# Patient Record
Sex: Female | Born: 1990 | Race: Black or African American | Hispanic: No | Marital: Single | Smoking: Former smoker
Health system: Southern US, Community
[De-identification: ages and names within clinical notes are randomized; demographics above are authoritative.]

## PROBLEM LIST (undated history)

## (undated) ENCOUNTER — Emergency Department: Admission: EM | Payer: Self-pay | Source: Home / Self Care

## (undated) ENCOUNTER — Inpatient Hospital Stay (HOSPITAL_COMMUNITY): Payer: Self-pay

## (undated) DIAGNOSIS — R87629 Unspecified abnormal cytological findings in specimens from vagina: Secondary | ICD-10-CM

## (undated) HISTORY — PX: NO PAST SURGERIES: SHX2092

---

## 2012-08-05 ENCOUNTER — Encounter (HOSPITAL_COMMUNITY): Payer: Self-pay | Admitting: Nurse Practitioner

## 2012-08-05 ENCOUNTER — Emergency Department (HOSPITAL_COMMUNITY)
Admission: EM | Admit: 2012-08-05 | Discharge: 2012-08-05 | Disposition: A | Discharge status: Home / Self Care | Payer: BC Managed Care – PPO | Attending: Emergency Medicine | Admitting: Emergency Medicine

## 2012-08-05 DIAGNOSIS — R300 Dysuria: Secondary | ICD-10-CM | POA: Insufficient documentation

## 2012-08-05 DIAGNOSIS — Z3202 Encounter for pregnancy test, result negative: Secondary | ICD-10-CM | POA: Insufficient documentation

## 2012-08-05 DIAGNOSIS — N730 Acute parametritis and pelvic cellulitis: Secondary | ICD-10-CM

## 2012-08-05 DIAGNOSIS — N898 Other specified noninflammatory disorders of vagina: Secondary | ICD-10-CM | POA: Insufficient documentation

## 2012-08-05 DIAGNOSIS — Z8619 Personal history of other infectious and parasitic diseases: Secondary | ICD-10-CM | POA: Insufficient documentation

## 2012-08-05 DIAGNOSIS — R350 Frequency of micturition: Secondary | ICD-10-CM | POA: Insufficient documentation

## 2012-08-05 DIAGNOSIS — F17200 Nicotine dependence, unspecified, uncomplicated: Secondary | ICD-10-CM | POA: Insufficient documentation

## 2012-08-05 LAB — POCT PREGNANCY, URINE: Preg Test, Ur: NEGATIVE

## 2012-08-05 LAB — URINALYSIS, ROUTINE W REFLEX MICROSCOPIC
Glucose, UA: NEGATIVE mg/dL
Protein, ur: NEGATIVE mg/dL
Specific Gravity, Urine: 1.018 (ref 1.005–1.030)
Urobilinogen, UA: 1 mg/dL (ref 0.0–1.0)

## 2012-08-05 LAB — CBC WITH DIFFERENTIAL/PLATELET
Basophils Absolute: 0 10*3/uL (ref 0.0–0.1)
HCT: 37.7 % (ref 36.0–46.0)
Lymphocytes Relative: 33 % (ref 12–46)
Neutro Abs: 6.1 10*3/uL (ref 1.7–7.7)
Platelets: 266 10*3/uL (ref 150–400)
RDW: 13.3 % (ref 11.5–15.5)
WBC: 10 10*3/uL (ref 4.0–10.5)

## 2012-08-05 LAB — COMPREHENSIVE METABOLIC PANEL
ALT: 9 U/L (ref 0–35)
AST: 12 U/L (ref 0–37)
Alkaline Phosphatase: 53 U/L (ref 39–117)
CO2: 25 mEq/L (ref 19–32)
Chloride: 106 mEq/L (ref 96–112)
GFR calc non Af Amer: >90 mL/min (ref >90)
Sodium: 141 mEq/L (ref 135–145)
Total Bilirubin: 0.2 mg/dL — ABNORMAL LOW (ref 0.3–1.2)

## 2012-08-05 LAB — URINE MICROSCOPIC-ADD ON

## 2012-08-05 LAB — WET PREP, GENITAL: Yeast Wet Prep HPF POC: NONE SEEN

## 2012-08-05 MED ORDER — CEFTRIAXONE SODIUM 250 MG IJ SOLR
250.0000 mg | Freq: Once | INTRAMUSCULAR | Status: AC
  Administered 2012-08-05: 250 mg via INTRAMUSCULAR
  Filled 2012-08-05: qty 250

## 2012-08-05 MED ORDER — OXYCODONE-ACETAMINOPHEN 5-325 MG PO TABS
2.0000 | ORAL_TABLET | Freq: Once | ORAL | Status: AC
  Administered 2012-08-05: 2 via ORAL
  Filled 2012-08-05: qty 2

## 2012-08-05 MED ORDER — DOXYCYCLINE HYCLATE 100 MG PO CAPS: 100.0000 mg | ORAL_CAPSULE | Freq: Two times a day (BID) | ORAL | Status: AC

## 2012-08-05 MED ORDER — ACETAMINOPHEN-CODEINE #3 300-30 MG PO TABS: 1.0000 | ORAL_TABLET | ORAL | Status: DC | PRN

## 2012-08-05 NOTE — ED Notes (Signed)
Pt discharged to home with family. NAD.  

## 2012-08-05 NOTE — ED Provider Notes (Signed)
History    CSN: [id number] Arrival date & time 08/05/12  1340  First MD Initiated Contact with Patient 08/05/12 1417     Chief Complaint  Patient presents with  . Abdominal Pain   HPI  Pt is a 22 yo AA female with pmh of trichomonas infection who presents with lower abdominal pain that began three days ago. Pt describes intermittent 8/10 crampy lower abdominal pain with radiation to groin that began three days ago and seems to be getting worse, with no relief with tylenol. Pt reports she has never had these symptoms before. She also has urinary frequency and thick white discharge for three days. No urgency, burning, dysuria, or hematuria. No fevers, chills, nausea, vomiting, or change in bowel movement.   LMP was June 24th. Pt is sexually active with 2 partners, one female and one female. She is not on birth control but uses protection. Menarche was at age 55 and periods are normal and regular with normal blood loss. She had trichomonas infection at age 62 that was treated. She has been tested for HIV and waiting for results. Had miscarriage when she was 22 years old.      History reviewed. No pertinent past medical history. History reviewed. No pertinent past surgical history. History reviewed. No pertinent family history. History  Substance Use Topics  . Smoking status: Current Every Day Smoker -- 0.01 packs/day  . Smokeless tobacco: Not on file  . Alcohol Use: No   OB History   Grav Para Term Preterm Abortions TAB SAB Ect Mult Living                 Review of Systems  Constitutional: Positive for appetite change. Negative for fever, chills, fatigue and unexpected weight change.  Respiratory: Negative for shortness of breath.   Cardiovascular: Negative for chest pain and leg swelling.  Gastrointestinal: Positive for abdominal pain. Negative for nausea, vomiting, diarrhea, constipation and blood in stool.  Genitourinary: Positive for dysuria, frequency and vaginal discharge.  Negative for urgency, flank pain, vaginal bleeding and difficulty urinating.  Musculoskeletal: Negative for myalgias.  Neurological: Negative for dizziness and weakness.    Allergies  Review of patient's allergies indicates no known allergies.  Home Medications  No current outpatient prescriptions on file. BP 104/57  Pulse 74  Temp(Src) 98.7 F (37.1 C) (Oral)  Resp 16  Ht 5\' 5"  (1.651 m)  Wt 165 lb (74.844 kg)  BMI 27.46 kg/m2  SpO2 100% Physical Exam  Constitutional: She is oriented to person, place, and time. She appears well-developed and well-nourished. No distress.  HENT:  Head: Normocephalic and atraumatic.  Eyes: EOM are normal.  Neck: Normal range of motion. Neck supple.  Cardiovascular: Normal rate and regular rhythm.   Pulmonary/Chest: Effort normal and breath sounds normal. No respiratory distress.  Abdominal: Soft. Bowel sounds are normal. She exhibits no distension. There is tenderness (tenderness to palpation of lower abominal region and groin L>R). There is no rebound and no guarding.  Genitourinary: Vagina normal.  Pelvic exam - cervical motion/adnexal tenderness, normal appearing cervical os with surrounding white discharge, no punctuate hemorrhages,  fishy odor, or  lesions/ulcerations on vagina  Musculoskeletal: Normal range of motion. She exhibits no edema.  Neurological: She is alert and oriented to person, place, and time.  Skin: Skin is warm and dry. She is not diaphoretic.  Psychiatric: She has a normal mood and affect.    ED Course  Procedures (including critical care time) Labs Reviewed  WET  PREP, GENITAL - Abnormal; Notable for the following:    Trich, Wet Prep MODERATE (*)    Clue Cells Wet Prep HPF POC FEW (*)    WBC, Wet Prep HPF POC MODERATE (*)    All other components within normal limits  URINALYSIS, ROUTINE W REFLEX MICROSCOPIC - Abnormal; Notable for the following:    APPearance CLOUDY (*)    Nitrite POSITIVE (*)    Leukocytes, UA  TRACE (*)    All other components within normal limits  COMPREHENSIVE METABOLIC PANEL - Abnormal; Notable for the following:    Total Bilirubin 0.2 (*)    All other components within normal limits  URINE MICROSCOPIC-ADD ON - Abnormal; Notable for the following:    Bacteria, UA FEW (*)    All other components within normal limits  GC/CHLAMYDIA PROBE AMP  URINE CULTURE  CBC WITH DIFFERENTIAL  POCT PREGNANCY, URINE   No results found. 1. PID (acute pelvic inflammatory disease)     MDM  Assessment: 22 yo AA female with pmh of trichomonas infection who presents with lower abdominal pain and urinary frequency that began three days ago.     Plan:  Abdominal Pain - UTI vs Cervicitis vs PID  -Obtain urine pregnancy test ---> negative - Perform pelvic exam- bimanual and obtain cervical specimen --> cervical motion tenderness and white mucous cervical discharge on exam -Obtain Gonorrhea/chlymadia  DNA assay ---> awaiting results  -Obtain trichomonas wet mount prep -Obtain UA and culture ---> awaiting results -Obtain CBC w/ diff ---> WNL -Obtain CMP --> WNL -Administer percocet 5-325mg  for pain   Disposition:  Home ---> Pt not pregnant, is afebrile,  with no leukocytosis and lower abdominal/pelvic pain with cervical motion/adnexal tenderness and cervical discharge on exam-> suspect PID. Awaiting  STD testing,  given ceftriaxone 250 mg IM in ED for suspected gonorrhea cervicitis and prescribed doxycylcine x 14 days for chylamydia cervicitis coinfection. Also awaiting UA and wet mount results.     Discharge:  -To take Doxycycline x 14 days  -Acetaminophen-codeine 300-30mg  Q4 hrs pain -[facility] or sooner   Update: After viewing results of UA and wet mount, called patient to tell her to  [facility] since will need treatment for trichomonas/bacterial vaginosis and UTI infection.       Otis Brace, MD 08/05/12 2004  Otis Brace, MD 08/05/12 2105

## 2012-08-05 NOTE — Discharge Instructions (Signed)
Pelvic Inflammatory Disease °Pelvic inflammatory disease (PID) refers to an infection in some or all of the female organs. The infection can be in the uterus, ovaries, fallopian tubes, or the surrounding tissues in the pelvis. PID can cause abdominal or pelvic pain that comes on suddenly (acute pelvic pain). PID is a serious infection because it can lead to lasting (chronic) pelvic pain or the inability to have children (infertile).  °CAUSES  °The infection is often caused by the normal bacteria found in the vaginal tissues. PID may also be caused by an infection that is spread during sexual contact. PID can also occur following:  °· The birth of a baby.   °· A miscarriage.   °· An abortion.   °· Major pelvic surgery.   °· The use of an intrauterine device (IUD).   °· A sexual assault.   °RISK FACTORS °Certain factors can put a person at higher risk for PID, such as: °· Being younger than 25 years. °· Being sexually active at a young age. °· Using nonbarrier contraception. °· Having multiple sexual partners. °· Having sex with someone who has symptoms of a genital infection. °· Using oral contraception. °Other times, certain behaviors can increase the possibility of getting PID, such as: °· Having sex during your period. °· Using a vaginal douche. °· Having an intrauterine device (IUD) in place. °SYMPTOMS  °· Abdominal or pelvic pain.   °· Fever.   °· Chills.   °· Abnormal vaginal discharge. °· Abnormal uterine bleeding.   °· Unusual pain shortly after finishing your period. °DIAGNOSIS  °Your caregiver will choose some of the following methods to make a diagnosis, such as:  °· Performing a physical exam and history. A pelvic exam typically reveals a very tender uterus and surrounding pelvis.   °· Ordering laboratory tests including a pregnancy test, blood tests, and urine test.  °· Ordering cultures of the vagina and cervix to check for a sexually transmitted infection (STI). °· Performing an ultrasound.    °· Performing a laparoscopic procedure to look inside the pelvis.   °TREATMENT  °· Antibiotic medicines may be prescribed and taken by mouth.   °· Sexual partners may be treated when the infection is caused by a sexually transmitted disease (STD).   °· Hospitalization may be needed to give antibiotics intravenously. °· Surgery may be needed, but this is rare. °It may take weeks until you are completely well. If you are diagnosed with PID, you should also be checked for human immunodeficiency virus (HIV).   °HOME CARE INSTRUCTIONS  °· If given, take your antibiotics as directed. Finish the medicine even if you start to feel better.   °· Only take over-the-counter or prescription medicines for pain, discomfort, or fever as directed by your caregiver.   °· Do not have sexual intercourse until treatment is completed or as directed by your caregiver. If PID is confirmed, your recent sexual partner(s) will need treatment.   °· Keep your follow-up appointments. °SEEK MEDICAL CARE IF:  °· You have increased or abnormal vaginal discharge.   °· You need prescription medicine for your pain.   °· You vomit.   °· You cannot take your medicines.   °· Your partner has an STD.   °SEEK IMMEDIATE MEDICAL CARE IF:  °· You have a fever.   °· You have increased abdominal or pelvic pain.   °· You have chills.   °· You have pain when you urinate.   °· You are not better after 72 hours following treatment.   °MAKE SURE YOU:  °· Understand these instructions. °· Will watch your condition. °· Will get help right away if you are not doing well or get worse. °  pelvic pain.    You have chills.    You have pain when you urinate.    You are not better after 72 hours following treatment.   MAKE SURE YOU:    Understand these instructions.   Will watch your condition.   Will get help right away if you are not doing well or get worse.  Document Released: 01/10/2005 Document Revised: 10/05/2011 Document Reviewed: 01/06/2011  [employer] Patient Information 2014 [employer].

## 2012-08-05 NOTE — ED Notes (Signed)
C/o intermittent abd and pelvic pain x 3 days, describes as "sharp cramps." Denies any bowel/bladder changes. Reports thick vaginal discharge also

## 2012-08-06 LAB — URINE CULTURE: Colony Count: NO GROWTH

## 2012-08-07 NOTE — ED Provider Notes (Signed)
I performed a history and physical examination of  Katherine Spears and discussed her management with Dr. Johna Roles. I agree with the history, physical, assessment, and plan of care, with the following exceptions: None I was present for the following procedures: None  Time Spent in Critical Care of the patient: None  Time spent in discussions with the patient and family: 5 minutes  Katherine Spears   Derwood Kaplan, MD 08/07/12 0300

## 2012-08-08 LAB — GC/CHLAMYDIA PROBE AMP
CT Probe RNA: NEGATIVE
GC Probe RNA: NEGATIVE

## 2013-09-12 ENCOUNTER — Encounter (HOSPITAL_COMMUNITY): Payer: Self-pay | Admitting: Emergency Medicine

## 2013-09-12 ENCOUNTER — Emergency Department (HOSPITAL_COMMUNITY)
Admission: EM | Admit: 2013-09-12 | Discharge: 2013-09-12 | Disposition: A | Discharge status: Home / Self Care | Payer: Medicaid Other | Attending: Emergency Medicine | Admitting: Emergency Medicine

## 2013-09-12 ENCOUNTER — Emergency Department (HOSPITAL_COMMUNITY): Payer: Medicaid Other

## 2013-09-12 DIAGNOSIS — R109 Unspecified abdominal pain: Secondary | ICD-10-CM | POA: Diagnosis not present

## 2013-09-12 DIAGNOSIS — O9989 Other specified diseases and conditions complicating pregnancy, childbirth and the puerperium: Secondary | ICD-10-CM | POA: Diagnosis present

## 2013-09-12 DIAGNOSIS — R5383 Other fatigue: Secondary | ICD-10-CM

## 2013-09-12 DIAGNOSIS — R42 Dizziness and giddiness: Secondary | ICD-10-CM | POA: Diagnosis not present

## 2013-09-12 DIAGNOSIS — R110 Nausea: Secondary | ICD-10-CM

## 2013-09-12 DIAGNOSIS — Z79899 Other long term (current) drug therapy: Secondary | ICD-10-CM | POA: Insufficient documentation

## 2013-09-12 DIAGNOSIS — N898 Other specified noninflammatory disorders of vagina: Secondary | ICD-10-CM | POA: Insufficient documentation

## 2013-09-12 DIAGNOSIS — R5381 Other malaise: Secondary | ICD-10-CM | POA: Insufficient documentation

## 2013-09-12 DIAGNOSIS — O210 Mild hyperemesis gravidarum: Secondary | ICD-10-CM | POA: Insufficient documentation

## 2013-09-12 DIAGNOSIS — O99330 Smoking (tobacco) complicating pregnancy, unspecified trimester: Secondary | ICD-10-CM | POA: Insufficient documentation

## 2013-09-12 DIAGNOSIS — Z3490 Encounter for supervision of normal pregnancy, unspecified, unspecified trimester: Secondary | ICD-10-CM

## 2013-09-12 DIAGNOSIS — R531 Weakness: Secondary | ICD-10-CM

## 2013-09-12 LAB — CBC
HCT: 37.5 % (ref 36.0–46.0)
Hemoglobin: 12.4 g/dL (ref 12.0–15.0)
MCH: 28.3 pg (ref 26.0–34.0)
MCHC: 33.1 g/dL (ref 30.0–36.0)
MCV: 85.6 fL (ref 78.0–100.0)
Platelets: 255 10*3/uL (ref 150–400)
RBC: 4.38 MIL/uL (ref 3.87–5.11)
RDW: 12.9 % (ref 11.5–15.5)
WBC: 11.7 10*3/uL — ABNORMAL HIGH (ref 4.0–10.5)

## 2013-09-12 LAB — URINALYSIS, ROUTINE W REFLEX MICROSCOPIC
Bilirubin Urine: NEGATIVE
Glucose, UA: NEGATIVE mg/dL
Hgb urine dipstick: NEGATIVE
Ketones, ur: 15 mg/dL — AB
Leukocytes, UA: NEGATIVE
Nitrite: NEGATIVE
Protein, ur: NEGATIVE mg/dL
Specific Gravity, Urine: 1.028 (ref 1.005–1.030)
Urobilinogen, UA: 1 mg/dL (ref 0.0–1.0)
pH: 7 (ref 5.0–8.0)

## 2013-09-12 LAB — BASIC METABOLIC PANEL
Anion gap: 18 — ABNORMAL HIGH (ref 5–15)
BUN: 7 mg/dL (ref 6–23)
CHLORIDE: 103 meq/L (ref 96–112)
CO2: 19 mEq/L (ref 19–32)
CREATININE: 0.59 mg/dL (ref 0.50–1.10)
Calcium: 9.6 mg/dL (ref 8.4–10.5)
GFR calc Af Amer: >90 mL/min (ref >90)
GFR calc non Af Amer: >90 mL/min (ref >90)
GLUCOSE: 61 mg/dL — AB (ref 70–99)
POTASSIUM: 4 meq/L (ref 3.7–5.3)
Sodium: 140 mEq/L (ref 137–147)

## 2013-09-12 LAB — ABO/RH: ABO/RH(D): O POS

## 2013-09-12 LAB — HCG, QUANTITATIVE, PREGNANCY: hCG, Beta Chain, Quant, S: 44423 m[IU]/mL — ABNORMAL HIGH (ref <5)

## 2013-09-12 LAB — WET PREP, GENITAL
CLUE CELLS WET PREP: NONE SEEN
TRICH WET PREP: NONE SEEN
Yeast Wet Prep HPF POC: NONE SEEN

## 2013-09-12 MED ORDER — SODIUM CHLORIDE 0.9 % IV BOLUS (SEPSIS): 1000.0000 mL | Freq: Once | INTRAVENOUS | Status: DC

## 2013-09-12 MED ORDER — ONDANSETRON HCL 4 MG PO TABS: 4.0000 mg | ORAL_TABLET | Freq: Four times a day (QID) | ORAL | Status: DC

## 2013-09-12 NOTE — ED Notes (Addendum)
lmp in may 6 and knows she is preg states she has been vomiting a lot and feels weak and occ feels like she may pass out  Has not seen an OB dr yet G 2 P 0 A 1 L o

## 2013-09-12 NOTE — ED Provider Notes (Signed)
CSN: [id number]     Arrival date & time 09/12/13  1346 History   First MD Initiated Contact with Patient 09/12/13 1650     Chief Complaint  Patient presents with  . Possible Pregnancy     (Consider location/radiation/quality/duration/timing/severity/associated sxs/prior Treatment) HPI Katherine Spears is a 23 y.o. female who presents emergency department complaining of dizziness, weakness, abdominal cramping. Patient states that she is pregnant,states she took home pregnancy test. Her last menstrual period was on May 6. She states this is her second pregnancy, first pregnancy she had a spontaneous abortion at around [redacted] weeks gestation. She states that she has been having intermittent nausea and vomiting, she states that she feels dizzy and lightheaded, and her vision gets dark, especially when she stands up or leaning forward. Patient also reports lower abdominal cramping that radiates to bilateral flank. She denies any vaginal bleeding. She states she is having vaginal discharge that is larger than usual. She denies any urinary symptoms. She states she has been trying to drink plenty of fluids but her symptoms are not improving. She has not seen an OB/GYN doctor yet. She is otherwise healthy with no medical problems.  History reviewed. No pertinent past medical history. History reviewed. No pertinent past surgical history. No family history on file. History  Substance Use Topics  . Smoking status: Current Every Day Smoker -- 0.01 packs/day  . Smokeless tobacco: Not on file  . Alcohol Use: No   OB History   Grav Para Term Preterm Abortions TAB SAB Ect Mult Living                 Review of Systems  Constitutional: Positive for fatigue. Negative for fever and chills.  HENT: Negative.   Respiratory: Negative for cough, chest tightness and shortness of breath.   Cardiovascular: Negative for chest pain, palpitations and leg swelling.  Gastrointestinal: Positive for nausea, vomiting and  abdominal pain. Negative for diarrhea and blood in stool.  Genitourinary: Positive for vaginal discharge and pelvic pain. Negative for dysuria, hematuria, flank pain, vaginal bleeding and vaginal pain.  Musculoskeletal: Negative for arthralgias, myalgias, neck pain and neck stiffness.  Skin: Negative for rash.  Neurological: Positive for dizziness, weakness and light-headedness. Negative for headaches.  All other systems reviewed and are negative.     Allergies  Review of patient's allergies indicates no known allergies.  Home Medications   Prior to Admission medications   Medication Sig Start Date End Date Taking? Authorizing Provider  Prenatal Vit-Fe Fumarate-FA (PRENATAL MULTIVITAMIN) TABS tablet Take 1 tablet by mouth 2 (two) times daily.   Yes Historical Provider, MD   BP 94/55  Pulse 88  Temp(Src) 98.2 F (36.8 C)  Resp 16  SpO2 99% Physical Exam  Nursing note and vitals reviewed. Constitutional: She is oriented to person, place, and time. She appears well-developed and well-nourished. No distress.  HENT:  Head: Normocephalic.  Eyes: Conjunctivae are normal.  Neck: Normal range of motion. Neck supple.  Cardiovascular: Normal rate, regular rhythm and normal heart sounds.   Pulmonary/Chest: Effort normal and breath sounds normal. No respiratory distress. She has no wheezes. She has no rales.  Abdominal: Soft. Bowel sounds are normal. She exhibits no distension. There is no tenderness. There is no rebound.  Suprapubic tenderness. No CVA tenderness bilaterally.  Genitourinary:  Normal external genitalia. Normal vaginal canal. No discharge. Cervix closed. No CMT. Uterine is gravid with diffuse tenderness.   Musculoskeletal: She exhibits no edema.  Neurological: She is alert and oriented  to person, place, and time.  Skin: Skin is warm and dry.  Psychiatric: She has a normal mood and affect. Her behavior is normal.    ED Course  Procedures (including critical care  time) Labs Review Labs Reviewed  WET PREP, GENITAL - Abnormal; Notable for the following:    WBC, Wet Prep HPF POC FEW (*)    All other components within normal limits  CBC - Abnormal; Notable for the following:    WBC 11.7 (*)    All other components within normal limits  HCG, QUANTITATIVE, PREGNANCY - Abnormal; Notable for the following:    hCG, Beta Chain, Quant, S 78295 (*)    All other components within normal limits  URINALYSIS, ROUTINE W REFLEX MICROSCOPIC - Abnormal; Notable for the following:    APPearance HAZY (*)    Ketones, ur 15 (*)    All other components within normal limits  BASIC METABOLIC PANEL - Abnormal; Notable for the following:    Glucose, Bld 61 (*)    Anion gap 18 (*)    All other components within normal limits  GC/CHLAMYDIA PROBE AMP  ABO/RH    Imaging Review No results found.   EKG Interpretation None      MDM   Final diagnoses:  Pregnancy  Nausea  Weakness    Patient is estimated to be 15 weeks and 1 day pregnant by last menstrual cycle. She is having lower abdominal cramping. Will perform pelvic exam, get ultrasound to insure IUP. Will get urinalysis. Orthostatic vital signs ordered, IV fluids ordered   8:22 PM Unable to get IV, pt is able to take POs. Encouraged oral hydration. She does not appear to be orthostatic  By VS. No difficulty ambulating. Labs and UA unremarkable. VS normal. Pelvic exam normal, except for uterine tenderness. US obtained which showed normal pregnancy at 15wks3d. Will d/c home with zofran and close outpatient ob follow up.   Filed Vitals:   09/12/13 1354 09/12/13 1840  BP: 94/55 119/68  Pulse: 88 81  Temp: 98.2 F (36.8 C)   Resp: 16   SpO2: 99% 100%     Lottie Mussel, PA-C 09/12/13 2024

## 2013-09-12 NOTE — Discharge Instructions (Signed)
Continue prenatal vitamins. Take tylenol for pain. Zofran for nausea. Follow up with OB/GYN for further prenatal care. You are estimated 15wks 3 days, baby appears well on US.   Abdominal Pain During Pregnancy Abdominal pain is common in pregnancy. Most of the time, it does not cause harm. There are many causes of abdominal pain. Some causes are more serious than others. Some of the causes of abdominal pain in pregnancy are easily diagnosed. Occasionally, the diagnosis takes time to understand. Other times, the cause is not determined. Abdominal pain can be a sign that something is very wrong with the pregnancy, or the pain may have nothing to do with the pregnancy at all. For this reason, always tell your health care provider if you have any abdominal discomfort. HOME CARE INSTRUCTIONS  Monitor your abdominal pain for any changes. The following actions may help to alleviate any discomfort you are experiencing:  Do not have sexual intercourse or put anything in your vagina until your symptoms go away completely.  Get plenty of rest until your pain improves.  Drink clear fluids if you feel nauseous. Avoid solid food as long as you are uncomfortable or nauseous.  Only take over-the-counter or prescription medicine as directed by your health care provider.  Keep all follow-up appointments with your health care provider. SEEK IMMEDIATE MEDICAL CARE IF:  You are bleeding, leaking fluid, or passing tissue from the vagina.  You have increasing pain or cramping.  You have persistent vomiting.  You have painful or bloody urination.  You have a fever.  You notice a decrease in your baby's movements.  You have extreme weakness or feel faint.  You have shortness of breath, with or without abdominal pain.  You develop a severe headache with abdominal pain.  You have abnormal vaginal discharge with abdominal pain.  You have persistent diarrhea.  You have abdominal pain that continues even  after rest, or gets worse. MAKE SURE YOU:   Understand these instructions.  Will watch your condition.  Will get help right away if you are not doing well or get worse. Document Released: 01/10/2005 Document Revised: 10/31/2012 Document Reviewed: 08/09/2012 [facility] Patient Information 2015 [place], [place]. This information is not intended to replace advice given to you by your health care provider. Make sure you discuss any questions you have with your health care provider.

## 2013-09-12 NOTE — ED Notes (Signed)
Patient C/O being dehydrated. States that she is pregnant and having nausea and vomiting.  C/O pre syncope when she changes position.  States that she has been drinking water and Gatorade.

## 2013-09-13 LAB — GC/CHLAMYDIA PROBE AMP
CT Probe RNA: NEGATIVE
GC PROBE AMP APTIMA: NEGATIVE

## 2013-09-17 NOTE — ED Provider Notes (Signed)
Medical screening examination/treatment/procedure(s) were performed by non-physician practitioner and as supervising physician I was immediately available for consultation/collaboration.   EKG Interpretation None       Monia Timmers, MD 09/17/13 1603 

## 2013-10-31 ENCOUNTER — Other Ambulatory Visit (HOSPITAL_COMMUNITY): Payer: Self-pay | Admitting: Urology

## 2013-10-31 DIAGNOSIS — Z3689 Encounter for other specified antenatal screening: Secondary | ICD-10-CM

## 2013-11-06 ENCOUNTER — Ambulatory Visit (HOSPITAL_COMMUNITY): Payer: Medicaid Other | Attending: Urology

## 2013-11-18 LAB — OB RESULTS CONSOLE RUBELLA ANTIBODY, IGM: Rubella: IMMUNE

## 2013-11-18 LAB — OB RESULTS CONSOLE RPR: RPR: NONREACTIVE

## 2013-11-18 LAB — OB RESULTS CONSOLE ABO/RH: RH TYPE: POSITIVE

## 2013-11-18 LAB — OB RESULTS CONSOLE ANTIBODY SCREEN: Antibody Screen: NEGATIVE

## 2013-11-18 LAB — OB RESULTS CONSOLE HEPATITIS B SURFACE ANTIGEN: Hepatitis B Surface Ag: NEGATIVE

## 2013-11-18 LAB — OB RESULTS CONSOLE HIV ANTIBODY (ROUTINE TESTING): HIV: NONREACTIVE

## 2014-01-24 NOTE — L&D Delivery Note (Signed)
Delivery Note Patient pushed for less than 5 minutes after she was noted to be C/C/+3. At 11:09 AM a viable and healthy female was delivered via Vaginal, Spontaneous Delivery (Presentation: Left Occiput Anterior).  APGAR: 9, 9; weight 6 lb 4.5 oz (2849 g).   Placenta status: Intact, Spontaneous.  Cord: 3 vessels with the following complications: None.   Patient received IM Pitocin because she progressed fast without having an IV placed. There was a slight ooze post partum so IM methergine administered and will put patient on po methergine series.  Re-attempt at placing IV was unsuccessful.   Anesthesia: Local  Episiotomy: None Lacerations: 2nd degree;Perineal and right side wall Suture Repair: 2.0 vicryl Est. Blood Loss (mL): 400  Mom to postpartum.  Baby to Couplet care / Skin to Skin.  Essie HartINN, Umberto Pavek [name] 03/06/2014, 11:55 AM

## 2014-02-04 LAB — OB RESULTS CONSOLE GC/CHLAMYDIA
CHLAMYDIA, DNA PROBE: NEGATIVE
Gonorrhea: NEGATIVE

## 2014-02-04 LAB — OB RESULTS CONSOLE GBS: GBS: POSITIVE

## 2014-02-09 ENCOUNTER — Inpatient Hospital Stay (HOSPITAL_COMMUNITY)
Admission: AD | Admit: 2014-02-09 | Discharge: 2014-02-09 | Disposition: A | Discharge status: Home / Self Care | Payer: Medicaid Other | Source: Ambulatory Visit | Attending: Obstetrics and Gynecology | Admitting: Obstetrics and Gynecology

## 2014-02-09 ENCOUNTER — Encounter (HOSPITAL_COMMUNITY): Payer: Self-pay | Admitting: *Deleted

## 2014-02-09 ENCOUNTER — Inpatient Hospital Stay (HOSPITAL_COMMUNITY): Payer: Medicaid Other

## 2014-02-09 DIAGNOSIS — Z3A36 36 weeks gestation of pregnancy: Secondary | ICD-10-CM | POA: Diagnosis not present

## 2014-02-09 DIAGNOSIS — O9989 Other specified diseases and conditions complicating pregnancy, childbirth and the puerperium: Secondary | ICD-10-CM | POA: Insufficient documentation

## 2014-02-09 DIAGNOSIS — O329XX0 Maternal care for malpresentation of fetus, unspecified, not applicable or unspecified: Secondary | ICD-10-CM

## 2014-02-09 DIAGNOSIS — O409XX0 Polyhydramnios, unspecified trimester, not applicable or unspecified: Secondary | ICD-10-CM | POA: Insufficient documentation

## 2014-02-09 HISTORY — DX: Unspecified abnormal cytological findings in specimens from vagina: R87.629

## 2014-02-09 MED ORDER — ZOLPIDEM TARTRATE 5 MG PO TABS
5.0000 mg | ORAL_TABLET | Freq: Once | ORAL | Status: AC
  Administered 2014-02-09: 5 mg via ORAL
  Filled 2014-02-09: qty 1

## 2014-02-09 NOTE — MAU Note (Signed)
Dr. Henderson CloudHorvath called about SVE and unable to determine presenting part. Ultrasound ordered at this time. PT. Agreeable to plan of care.

## 2014-02-09 NOTE — MAU Note (Signed)
Pt. States she started to contract around 1500 today. Denies leakage of fluid or bleeding. Does feel the need to urinate frequently. Baby has been moving well per pt. Next appointment is scheduled for tomorrow at 8am.

## 2014-02-09 NOTE — MAU Note (Signed)
Dr. Henderson CloudHorvath called and updated with pt. Exam and fetal deceleration. To monitor for one hour and then do a BPP if strip is not reactive.

## 2014-02-09 NOTE — Discharge Instructions (Signed)
Fetal Movement Counts °Patient Name: __________________________________________________ Patient Due Date: ____________________ °Performing a fetal movement count is highly recommended in high-risk pregnancies, but it is good for every pregnant woman to do. Your health care provider may ask you to start counting fetal movements at 28 weeks of the pregnancy. Fetal movements often increase: °· After eating a full meal. °· After physical activity. °· After eating or drinking something sweet or cold. °· At rest. °Pay attention to when you feel the baby is most active. This will help you notice a pattern of your baby's sleep and wake cycles and what factors contribute to an increase in fetal movement. It is important to perform a fetal movement count at the same time each day when your baby is normally most active.  °HOW TO COUNT FETAL MOVEMENTS °1. Find a quiet and comfortable area to sit or lie down on your left side. Lying on your left side provides the best blood and oxygen circulation to your baby. °2. Write down the day and time on a sheet of paper or in a journal. °3. Start counting kicks, flutters, swishes, rolls, or jabs in a 2-hour period. You should feel at least 10 movements within 2 hours. °4. If you do not feel 10 movements in 2 hours, wait 2-3 hours and count again. Look for a change in the pattern or not enough counts in 2 hours. °SEEK MEDICAL CARE IF: °· You feel less than 10 counts in 2 hours, tried twice. °· There is no movement in over an hour. °· The pattern is changing or taking longer each day to reach 10 counts in 2 hours. °· You feel the baby is not moving as he or she usually does. °Date: ____________ Movements: ____________ Start time: ____________ Finish time: ____________  °Date: ____________ Movements: ____________ Start time: ____________ Finish time: ____________ °Date: ____________ Movements: ____________ Start time: ____________ Finish time: ____________ °Date: ____________ Movements:  ____________ Start time: ____________ Finish time: ____________ °Date: ____________ Movements: ____________ Start time: ____________ Finish time: ____________ °Date: ____________ Movements: ____________ Start time: ____________ Finish time: ____________ °Date: ____________ Movements: ____________ Start time: ____________ Finish time: ____________ °Date: ____________ Movements: ____________ Start time: ____________ Finish time: ____________  °Date: ____________ Movements: ____________ Start time: ____________ Finish time: ____________ °Date: ____________ Movements: ____________ Start time: ____________ Finish time: ____________ °Date: ____________ Movements: ____________ Start time: ____________ Finish time: ____________ °Date: ____________ Movements: ____________ Start time: ____________ Finish time: ____________ °Date: ____________ Movements: ____________ Start time: ____________ Finish time: ____________ °Date: ____________ Movements: ____________ Start time: ____________ Finish time: ____________ °Date: ____________ Movements: ____________ Start time: ____________ Finish time: ____________  °Date: ____________ Movements: ____________ Start time: ____________ Finish time: ____________ °Date: ____________ Movements: ____________ Start time: ____________ Finish time: ____________ °Date: ____________ Movements: ____________ Start time: ____________ Finish time: ____________ °Date: ____________ Movements: ____________ Start time: ____________ Finish time: ____________ °Date: ____________ Movements: ____________ Start time: ____________ Finish time: ____________ °Date: ____________ Movements: ____________ Start time: ____________ Finish time: ____________ °Date: ____________ Movements: ____________ Start time: ____________ Finish time: ____________  °Date: ____________ Movements: ____________ Start time: ____________ Finish time: ____________ °Date: ____________ Movements: ____________ Start time: ____________ Finish  time: ____________ °Date: ____________ Movements: ____________ Start time: ____________ Finish time: ____________ °Date: ____________ Movements: ____________ Start time: ____________ Finish time: ____________ °Date: ____________ Movements: ____________ Start time: ____________ Finish time: ____________ °Date: ____________ Movements: ____________ Start time: ____________ Finish time: ____________ °Date: ____________ Movements: ____________ Start time: ____________ Finish time: ____________  °Date: ____________ Movements: ____________ Start time: ____________ Finish   time: ____________ °Date: ____________ Movements: ____________ Start time: ____________ Finish time: ____________ °Date: ____________ Movements: ____________ Start time: ____________ Finish time: ____________ °Date: ____________ Movements: ____________ Start time: ____________ Finish time: ____________ °Date: ____________ Movements: ____________ Start time: ____________ Finish time: ____________ °Date: ____________ Movements: ____________ Start time: ____________ Finish time: ____________ °Date: ____________ Movements: ____________ Start time: ____________ Finish time: ____________  °Date: ____________ Movements: ____________ Start time: ____________ Finish time: ____________ °Date: ____________ Movements: ____________ Start time: ____________ Finish time: ____________ °Date: ____________ Movements: ____________ Start time: ____________ Finish time: ____________ °Date: ____________ Movements: ____________ Start time: ____________ Finish time: ____________ °Date: ____________ Movements: ____________ Start time: ____________ Finish time: ____________ °Date: ____________ Movements: ____________ Start time: ____________ Finish time: ____________ °Date: ____________ Movements: ____________ Start time: ____________ Finish time: ____________  °Date: ____________ Movements: ____________ Start time: ____________ Finish time: ____________ °Date: ____________  Movements: ____________ Start time: ____________ Finish time: ____________ °Date: ____________ Movements: ____________ Start time: ____________ Finish time: ____________ °Date: ____________ Movements: ____________ Start time: ____________ Finish time: ____________ °Date: ____________ Movements: ____________ Start time: ____________ Finish time: ____________ °Date: ____________ Movements: ____________ Start time: ____________ Finish time: ____________ °Date: ____________ Movements: ____________ Start time: ____________ Finish time: ____________  °Date: ____________ Movements: ____________ Start time: ____________ Finish time: ____________ °Date: ____________ Movements: ____________ Start time: ____________ Finish time: ____________ °Date: ____________ Movements: ____________ Start time: ____________ Finish time: ____________ °Date: ____________ Movements: ____________ Start time: ____________ Finish time: ____________ °Date: ____________ Movements: ____________ Start time: ____________ Finish time: ____________ °Date: ____________ Movements: ____________ Start time: ____________ Finish time: ____________ °Document Released: 02/09/2006 Document Revised: 05/27/2013 Document Reviewed: 11/07/2011 °[employer]® Patient Information ©2015 [employer]. This information is not intended to replace advice given to you by your health care provider. Make sure you discuss any questions you have with your health care provider. °Braxton Hicks Contractions °Contractions of the uterus can occur throughout pregnancy. Contractions are not always a sign that you are in labor.  °WHAT ARE BRAXTON HICKS CONTRACTIONS?  °Contractions that occur before labor are called Braxton Hicks contractions, or false labor. Toward the end of pregnancy (32-34 weeks), these contractions can develop more often and may become more forceful. This is not true labor because these contractions do not result in opening (dilatation) and thinning of the cervix. They  are sometimes difficult to tell apart from true labor because these contractions can be forceful and people have different pain tolerances. You should not feel embarrassed if you go to the hospital with false labor. Sometimes, the only way to tell if you are in true labor is for your health care provider to look for changes in the cervix. °If there are no prenatal problems or other health problems associated with the pregnancy, it is completely safe to be sent home with false labor and await the onset of true labor. °HOW CAN YOU TELL THE DIFFERENCE BETWEEN TRUE AND FALSE LABOR? °False Labor °· The contractions of false labor are usually shorter and not as hard as those of true labor.   °· The contractions are usually irregular.   °· The contractions are often felt in the front of the lower abdomen and in the groin.   °· The contractions may go away when you walk around or change positions while lying down.   °· The contractions get weaker and are shorter lasting as time goes on.   °· The contractions do not usually become progressively stronger, regular, and closer together as with true labor.   °True Labor °5. Contractions in true labor last 30-70 seconds, become   very regular, usually become more intense, and increase in frequency.   °6. The contractions do not go away with walking.   °7. The discomfort is usually felt in the top of the uterus and spreads to the lower abdomen and low back.   °8. True labor can be determined by your health care provider with an exam. This will show that the cervix is dilating and getting thinner.   °WHAT TO REMEMBER °· Keep up with your usual exercises and follow other instructions given by your health care provider.   °· Take medicines as directed by your health care provider.   °· Keep your regular prenatal appointments.   °· Eat and drink lightly if you think you are going into labor.   °· If Braxton Hicks contractions are making you uncomfortable:   °· Change your position from  lying down or resting to walking, or from walking to resting.   °· Sit and rest in a tub of warm water.   °· Drink 2-3 glasses of water. Dehydration may cause these contractions.   °· Do slow and deep breathing several times an hour.   °WHEN SHOULD I SEEK IMMEDIATE MEDICAL CARE? °Seek immediate medical care if: °· Your contractions become stronger, more regular, and closer together.   °· You have fluid leaking or gushing from your vagina.   °· You have a fever.   °· You pass blood-tinged mucus.   °· You have vaginal bleeding.   °· You have continuous abdominal pain.   °· You have low back pain that you never had before.   °· You feel your baby's head pushing down and causing pelvic pressure.   °· Your baby is not moving as much as it used to.   °Document Released: 01/10/2005 Document Revised: 01/15/2013 Document Reviewed: 10/22/2012 °Exit[employer]® Patient Information ©2015 [employer]. This information is not intended to replace advice given to you by your health care provider. Make sure you discuss any questions you have with your health care provider. ° °

## 2014-02-10 DIAGNOSIS — Z3A36 36 weeks gestation of pregnancy: Secondary | ICD-10-CM | POA: Insufficient documentation

## 2014-02-10 DIAGNOSIS — O329XX0 Maternal care for malpresentation of fetus, unspecified, not applicable or unspecified: Secondary | ICD-10-CM | POA: Insufficient documentation

## 2014-02-10 DIAGNOSIS — O409XX0 Polyhydramnios, unspecified trimester, not applicable or unspecified: Secondary | ICD-10-CM | POA: Insufficient documentation

## 2014-03-06 ENCOUNTER — Inpatient Hospital Stay (HOSPITAL_COMMUNITY)
Admission: AD | Admit: 2014-03-06 | Discharge: 2014-03-08 | DRG: 775 | Disposition: A | Discharge status: Home / Self Care | Payer: Medicaid Other | Source: Ambulatory Visit | Attending: Obstetrics & Gynecology | Admitting: Obstetrics & Gynecology

## 2014-03-06 ENCOUNTER — Encounter (HOSPITAL_COMMUNITY): Payer: Self-pay | Admitting: *Deleted

## 2014-03-06 DIAGNOSIS — O99824 Streptococcus B carrier state complicating childbirth: Secondary | ICD-10-CM | POA: Diagnosis present

## 2014-03-06 DIAGNOSIS — Z87891 Personal history of nicotine dependence: Secondary | ICD-10-CM | POA: Diagnosis not present

## 2014-03-06 DIAGNOSIS — IMO0001 Reserved for inherently not codable concepts without codable children: Secondary | ICD-10-CM

## 2014-03-06 DIAGNOSIS — O471 False labor at or after 37 completed weeks of gestation: Secondary | ICD-10-CM | POA: Diagnosis present

## 2014-03-06 DIAGNOSIS — Z3A40 40 weeks gestation of pregnancy: Secondary | ICD-10-CM | POA: Diagnosis present

## 2014-03-06 LAB — TYPE AND SCREEN
ABO/RH(D): O POS
Antibody Screen: NEGATIVE

## 2014-03-06 LAB — ABO/RH: ABO/RH(D): O POS

## 2014-03-06 LAB — CBC
HEMATOCRIT: 39.2 % (ref 36.0–46.0)
HEMOGLOBIN: 13.1 g/dL (ref 12.0–15.0)
MCH: 29.5 pg (ref 26.0–34.0)
MCHC: 33.4 g/dL (ref 30.0–36.0)
MCV: 88.3 fL (ref 78.0–100.0)
Platelets: 220 10*3/uL (ref 150–400)
RBC: 4.44 MIL/uL (ref 3.87–5.11)
RDW: 13.6 % (ref 11.5–15.5)
WBC: 23.8 10*3/uL — AB (ref 4.0–10.5)

## 2014-03-06 LAB — POCT FERN TEST: POCT FERN TEST: POSITIVE

## 2014-03-06 MED ORDER — ONDANSETRON HCL 4 MG/2ML IJ SOLN: 4.0000 mg | INTRAMUSCULAR | Status: DC | PRN

## 2014-03-06 MED ORDER — OXYCODONE-ACETAMINOPHEN 5-325 MG PO TABS: 2.0000 | ORAL_TABLET | ORAL | Status: DC | PRN

## 2014-03-06 MED ORDER — ONDANSETRON HCL 4 MG PO TABS: 4.0000 mg | ORAL_TABLET | ORAL | Status: DC | PRN

## 2014-03-06 MED ORDER — SODIUM CHLORIDE 0.9 % IV SOLN
2.0000 g | Freq: Once | INTRAVENOUS | Status: DC
  Filled 2014-03-06: qty 2000

## 2014-03-06 MED ORDER — OXYTOCIN BOLUS FROM INFUSION: 500.0000 mL | INTRAVENOUS | Status: DC

## 2014-03-06 MED ORDER — OXYCODONE-ACETAMINOPHEN 5-325 MG PO TABS: 1.0000 | ORAL_TABLET | ORAL | Status: DC | PRN

## 2014-03-06 MED ORDER — BENZOCAINE-MENTHOL 20-0.5 % EX AERO
1.0000 "application " | INHALATION_SPRAY | CUTANEOUS | Status: DC | PRN
  Administered 2014-03-06: 1 via TOPICAL
  Filled 2014-03-06: qty 56

## 2014-03-06 MED ORDER — OXYTOCIN 10 UNIT/ML IJ SOLN
INTRAMUSCULAR | Status: AC
  Administered 2014-03-06: 10 [IU] via INTRAMUSCULAR
  Filled 2014-03-06: qty 1

## 2014-03-06 MED ORDER — LACTATED RINGERS IV SOLN: INTRAVENOUS | Status: DC

## 2014-03-06 MED ORDER — ONDANSETRON HCL 4 MG/2ML IJ SOLN: 4.0000 mg | Freq: Four times a day (QID) | INTRAMUSCULAR | Status: DC | PRN

## 2014-03-06 MED ORDER — ACETAMINOPHEN 325 MG PO TABS: 650.0000 mg | ORAL_TABLET | ORAL | Status: DC | PRN

## 2014-03-06 MED ORDER — LANOLIN HYDROUS EX OINT
TOPICAL_OINTMENT | CUTANEOUS | Status: DC | PRN
Start: 2014-03-06 — End: 2014-03-08

## 2014-03-06 MED ORDER — SIMETHICONE 80 MG PO CHEW: 80.0000 mg | CHEWABLE_TABLET | ORAL | Status: DC | PRN

## 2014-03-06 MED ORDER — PRENATAL MULTIVITAMIN CH
1.0000 | ORAL_TABLET | Freq: Every day | ORAL | Status: DC
  Administered 2014-03-06 – 2014-03-08 (×3): 1 via ORAL
  Filled 2014-03-06 (×3): qty 1

## 2014-03-06 MED ORDER — TETANUS-DIPHTH-ACELL PERTUSSIS 5-2.5-18.5 LF-MCG/0.5 IM SUSP: 0.5000 mL | Freq: Once | INTRAMUSCULAR | Status: DC

## 2014-03-06 MED ORDER — FLEET ENEMA 7-19 GM/118ML RE ENEM
1.0000 | ENEMA | RECTAL | Status: DC | PRN
Start: 2014-03-06 — End: 2014-03-08

## 2014-03-06 MED ORDER — DIBUCAINE 1 % RE OINT
1.0000 "application " | TOPICAL_OINTMENT | RECTAL | Status: DC | PRN
  Administered 2014-03-06: 1 via RECTAL
  Filled 2014-03-06: qty 28

## 2014-03-06 MED ORDER — SENNOSIDES-DOCUSATE SODIUM 8.6-50 MG PO TABS
2.0000 | ORAL_TABLET | ORAL | Status: DC
Start: 2014-03-07 — End: 2014-03-08
  Administered 2014-03-06 – 2014-03-08 (×2): 2 via ORAL
  Filled 2014-03-06 (×2): qty 2

## 2014-03-06 MED ORDER — LIDOCAINE HCL (PF) 1 % IJ SOLN
INTRAMUSCULAR | Status: AC
  Filled 2014-03-06: qty 30

## 2014-03-06 MED ORDER — METHYLERGONOVINE MALEATE 0.2 MG/ML IJ SOLN: 0.2000 mg | INTRAMUSCULAR | Status: DC | PRN

## 2014-03-06 MED ORDER — DIPHENHYDRAMINE HCL 25 MG PO CAPS: 25.0000 mg | ORAL_CAPSULE | Freq: Four times a day (QID) | ORAL | Status: DC | PRN

## 2014-03-06 MED ORDER — IBUPROFEN 600 MG PO TABS
600.0000 mg | ORAL_TABLET | Freq: Four times a day (QID) | ORAL | Status: DC
  Administered 2014-03-06 – 2014-03-08 (×3): 600 mg via ORAL
  Filled 2014-03-06 (×7): qty 1

## 2014-03-06 MED ORDER — LACTATED RINGERS IV SOLN: 500.0000 mL | INTRAVENOUS | Status: DC | PRN

## 2014-03-06 MED ORDER — ZOLPIDEM TARTRATE 5 MG PO TABS: 5.0000 mg | ORAL_TABLET | Freq: Every evening | ORAL | Status: DC | PRN

## 2014-03-06 MED ORDER — OXYTOCIN 40 UNITS IN LACTATED RINGERS INFUSION - SIMPLE MED: 62.5000 mL/h | INTRAVENOUS | Status: DC

## 2014-03-06 MED ORDER — METHYLERGONOVINE MALEATE 0.2 MG PO TABS: 0.2000 mg | ORAL_TABLET | ORAL | Status: DC | PRN

## 2014-03-06 MED ORDER — CITRIC ACID-SODIUM CITRATE 334-500 MG/5ML PO SOLN: 30.0000 mL | ORAL | Status: DC | PRN

## 2014-03-06 MED ORDER — WITCH HAZEL-GLYCERIN EX PADS
1.0000 "application " | MEDICATED_PAD | CUTANEOUS | Status: DC | PRN
  Administered 2014-03-06: 1 via TOPICAL

## 2014-03-06 MED ORDER — METHYLERGONOVINE MALEATE 0.2 MG/ML IJ SOLN
INTRAMUSCULAR | Status: AC
  Administered 2014-03-06: 0.2 mg via INTRAMUSCULAR
  Filled 2014-03-06: qty 1

## 2014-03-06 MED ORDER — LIDOCAINE HCL (PF) 1 % IJ SOLN
30.0000 mL | INTRAMUSCULAR | Status: AC | PRN
  Administered 2014-03-06: 30 mL via SUBCUTANEOUS
  Filled 2014-03-06: qty 30

## 2014-03-06 NOTE — Lactation Note (Signed)
This note was copied from the chart of Katherine Spears. Lactation Consultation Note  P1, Mother states she has been shown hand express and has seen drops of colostrum. Baby asleep STS on mother's chest after having bf for 60 min off and on. Mother denies soreness.  Answered questions about pumping when she goes back to school in a few weeks. Reminded mother to latch deeply and not just on the nipple and reviewed cluster feeding. Mom encouraged to feed baby 8-12 times/24 hours and with feeding cues.  Mom made aware of O/P services, breastfeeding support groups, community resources, and our phone # for post-discharge questions.    Patient Name: Katherine Spears Today's Date: 03/06/2014 Reason for consult: Initial assessment   Maternal Data Has patient been taught Hand Expression?: Yes Does the patient have breastfeeding experience prior to this delivery?: No  Feeding Feeding Type: Breast Fed Length of feed: 10 min (on and off)  LATCH Score/Interventions Latch: Grasps breast easily, tongue down, lips flanged, rhythmical sucking.  Audible Swallowing: A few with stimulation Intervention(s): Skin to skin;Hand expression  Type of Nipple: Everted at rest and after stimulation  Comfort (Breast/Nipple): Soft / non-tender     Hold (Positioning): Assistance needed to correctly position infant at breast and maintain latch.  LATCH Score: 8  Lactation Tools Discussed/Used     Consult Status Consult Status: Follow-up Date: 03/07/14 Follow-up type: In-patient    Dahlia ByesBerkelhammer, Ruth [facility] 03/06/2014, 5:05 PM

## 2014-03-06 NOTE — MAU Note (Signed)
Pt reports ctx since at 230am about 4-5 min apart. Pt reports she had a gush of fluid and some bleeding. Good fetal movement reported.

## 2014-03-06 NOTE — H&P (Signed)
Katherine Spears is a 24 y.o. female presenting for regular painful contractions, no vaginal  Bleeding, no leaking of fluid.   Maternal Medical History:  Reason for admission: Contractions.  Nausea.  Contractions: Onset was 1-2 hours ago.   Frequency: regular.   Duration is approximately 60 seconds.   Perceived severity is strong.    Fetal activity: Perceived fetal activity is normal.   Last perceived fetal movement was within the past 12 hours.    Prenatal complications: no prenatal complications   OB History    Gravida Para Term Preterm AB TAB SAB Ectopic Multiple Living   2         0     Past Medical History  Diagnosis Date  . Vaginal Pap smear, abnormal    Past Surgical History  Procedure Laterality Date  . No past surgeries     Family History: family history is not on file. Social History:  reports that she has quit smoking. She does not have any smokeless tobacco history on file. She reports that she does not drink alcohol or use illicit drugs.   Prenatal Transfer Tool  Maternal Diabetes: No Genetic Screening: Normal Maternal Ultrasounds/Referrals: Normal Fetal Ultrasounds or other Referrals:  Other: Left EIF on Anatomy scan otherwise normal Maternal Substance Abuse:  No Significant Maternal Medications:  None Significant Maternal Lab Results:  Lab values include: Group B Strep positive Other Comments:  None  Review of Systems  Constitutional: Negative for fever and chills.  HENT: Negative for hearing loss.   Eyes: Negative for blurred vision and double vision.  Respiratory: Negative for cough and hemoptysis.   Cardiovascular: Negative for chest pain and palpitations.  Gastrointestinal: Negative for heartburn and nausea.  Genitourinary: Negative for dysuria and urgency.  Musculoskeletal: Negative for myalgias and neck pain.  Skin: Negative for rash.  Neurological: Negative for dizziness and headaches.  Endo/Heme/Allergies: Does not bruise/bleed easily.   Psychiatric/Behavioral: Negative for depression.  All other systems reviewed and are negative.   Dilation: 10 Effacement (%): 100 Station: +3 Exam by:: V. smith, CNM Blood pressure 116/69, pulse 87, temperature 97.6 F (36.4 C), temperature source Oral, resp. rate 18, height  (1.651 m), weight 73.483 kg (162 lb), SpO2 100 %. Maternal Exam:  Uterine Assessment: Contraction strength is moderate.  Contraction duration is 60 seconds. Contraction frequency is regular.   Abdomen: Patient reports no abdominal tenderness. Fundal height is 40 cm.   Estimated fetal weight is 2700 grams.   Fetal presentation: vertex  Introitus: Normal vulva. Normal vagina.  Ferning test: not done.  Nitrazine test: not done. Amniotic fluid character: not assessed.  Pelvis: adequate for delivery.   Cervix: Cervix evaluated by digital exam.   9cm in MAU  Fetal Exam Fetal Monitor Review: Mode: hand-held doppler probe.   Baseline rate: 145.  Variability: minimal (<5 bpm).   Pattern: no accelerations and variable decelerations.    Fetal State Assessment: Category II - tracings are indeterminate.     Physical Exam  Vitals reviewed. Constitutional: She is oriented to person, place, and time. She appears well-developed and well-nourished.  HENT:  Head: Normocephalic.  Eyes: Pupils are equal, round, and reactive to light.  Cardiovascular: Normal rate and regular rhythm.   Respiratory: Effort normal.  GI: Soft. Bowel sounds are normal.  Genitourinary: Vagina normal.  Musculoskeletal: Normal range of motion.  Neurological: She is alert and oriented to person, place, and time. She has normal reflexes.    Prenatal labs: ABO, Rh: O/Positive/-- (10/26  0000) Antibody: Negative (10/26 0000) Rubella: Immune (10/26 0000) RPR: Nonreactive (10/26 0000)  HBsAg: Negative (10/26 0000)  HIV: Non-reactive (10/26 0000)  GBS: Positive (01/12 0000)   Assessment/Plan: 24 yo G2P0 at 40 weeks 1 day in active  labor Admit to L&D Anticipate NSVD  Ampicillin for GBS positive   Katherine Spears Katherine Spears 03/06/2014, 11:52 AM

## 2014-03-07 ENCOUNTER — Encounter (HOSPITAL_COMMUNITY): Payer: Self-pay | Admitting: General Practice

## 2014-03-07 LAB — CBC
HEMATOCRIT: 30.1 % — AB (ref 36.0–46.0)
Hemoglobin: 10.3 g/dL — ABNORMAL LOW (ref 12.0–15.0)
MCH: 29.8 pg (ref 26.0–34.0)
MCHC: 34.2 g/dL (ref 30.0–36.0)
MCV: 87 fL (ref 78.0–100.0)
Platelets: 195 10*3/uL (ref 150–400)
RBC: 3.46 MIL/uL — ABNORMAL LOW (ref 3.87–5.11)
RDW: 13.7 % (ref 11.5–15.5)
WBC: 18.2 10*3/uL — ABNORMAL HIGH (ref 4.0–10.5)

## 2014-03-07 LAB — RPR: RPR: NONREACTIVE

## 2014-03-07 NOTE — Progress Notes (Signed)
UR chart review completed.  

## 2014-03-07 NOTE — Lactation Note (Signed)
This note was copied from the chart of Katherine Spears. Lactation Consultation Note  Mother latched baby in football hold.  Helped mother get more depth. Encouraged her to massage her breast during feeding to keep baby active. Sucks and swallows observed. Baby is fully dressed, suggest that if baby is sleepy during feedings she should feed STS. Provided comfort gels for tender nipples.  New mother doing well, needs some help with depth.   Patient Name: Katherine Spears Today's Date: 03/07/2014 Reason for consult: Follow-up assessment   Maternal Data    Feeding Feeding Type: Breast Fed  LATCH Score/Interventions Latch: Grasps breast easily, tongue down, lips flanged, rhythmical sucking.  Audible Swallowing: A few with stimulation Intervention(s): Alternate breast massage  Type of Nipple: Everted at rest and after stimulation  Comfort (Breast/Nipple): Filling, red/small blisters or bruises, mild/mod discomfort  Problem noted: Mild/Moderate discomfort  Hold (Positioning): Assistance needed to correctly position infant at breast and maintain latch. Intervention(s): Support Pillows;Skin to skin  LATCH Score: 7  Lactation Tools Discussed/Used     Consult Status Consult Status: Follow-up Date: 03/08/14 Follow-up type: In-patient    Dahlia ByesBerkelhammer, Javi Bollman [facility] 03/07/2014, 3:09 PM

## 2014-03-07 NOTE — Progress Notes (Signed)
POD#1 Pt without complaints, Lochia-wnl VSSAF IMP/ doing well Plan/ routine care.

## 2014-03-08 ENCOUNTER — Ambulatory Visit: Payer: Self-pay

## 2014-03-08 NOTE — Lactation Note (Signed)
This note was copied from the chart of Girl Christalynn Keir. Lactation Consultation Note  Patient Name: Girl Nils FlackDynishia Mancebo Today's Date: 03/08/2014 Reason for consult: Follow-up assessment;Difficult latch;Other (Comment) (breasts filling and mom now pumping for relief) LC spoke with RN, Joni FearsLeigha about possibly starting a DEBP tonight.  However, based on RN assessment, baby is latching well and mom will minimize pumping and apply ice packs if needed for engorgement tonight unless baby has lost more weight.  If weight >7% tonight, RN will initiate DEBP and supplement with ebm.   Maternal Data    Feeding Feeding Type: Breast Fed Length of feed: 21 min  LATCH Score/Interventions                      Lactation Tools Discussed/Used     Consult Status Consult Status: Follow-up Date: 03/09/14 Follow-up type: In-patient    Warrick ParisianBryant, Deigo Alonso [facility] 03/08/2014, 10:21 PM

## 2014-03-08 NOTE — Discharge Summary (Signed)
Obstetric Discharge Summary Reason for Admission: onset of labor Prenatal Procedures: ultrasound Intrapartum Procedures: spontaneous vaginal delivery Postpartum Procedures: none Complications-Operative and Postpartum: 2nd degree perineal laceration HEMOGLOBIN  Date Value Ref Range Status  03/07/2014 10.3* 12.0 - 15.0 g/dL Final    Comment:    DELTA CHECK NOTED REPEATED TO VERIFY    HCT  Date Value Ref Range Status  03/07/2014 30.1* 36.0 - 46.0 % Final    Physical Exam:  General: alert Lochia: appropriate Uterine Fundus: firm   Discharge Diagnoses: Term Pregnancy-delivered  Discharge Information: Date: 03/08/2014 Activity: pelvic rest Diet: routine Medications: PNV and Ibuprofen Condition: stable Instructions: refer to practice specific booklet Discharge to: home Follow-up Information    [facility] Sanjuana MaeWALDA [name], MD. Schedule an appointment as soon as possible for a visit in 1 month.   Specialty:  Obstetrics and Gynecology   Contact information:   [address] [place] [id number] [phone]       Newborn Data: Live born female  Birth Weight: 6 lb 4.5 oz (2849 g) APGAR: 9, 9  Home with mother.  ANDERSON,MARK E 03/08/2014, 10:03 AM

## 2014-03-08 NOTE — Lactation Note (Signed)
This note was copied from the chart of Girl Athene Ciesla. Lactation Consultation Note  Patient Name: Girl Nils FlackDynishia Mcgruder Today's Date: 03/08/2014 Reason for consult: Follow-up assessment;Difficult latch;Other (Comment) (breasts filling and mom now pumping for relief) Mom says baby is latching well now without nipple shield and she is also using hand pump and obtaining ebm which she is storing in refrigerator.  Mom says breasts are feeling heavy tonight and she pumped for 2 hours, alternating breasts.  LC will ask RN to provide DEBP but recommend mom only pump 15-20 minutes at a time on each breast or double, because she may overstimulate production.  LC reviewed milk storage guidelines in Baby and Me, page 25.   Maternal Data    Feeding    LATCH Score/Interventions              most recent LATCH score=10 per LC, MaryAnn        Lactation Tools Discussed/Used   Pumping and storing ebm (frequency and duration of pumping recommendations) RN to set-up DEBP tonight  Consult Status Consult Status: Follow-up Date: 03/09/14 Follow-up type: In-patient    Warrick ParisianBryant, Narda Fundora [facility] 03/08/2014, 9:56 PM

## 2014-03-08 NOTE — Lactation Note (Signed)
This note was copied from the chart of Katherine Deairra Samarin. Lactation Consultation Note  Baby was cueing so she was put to the breast.  She attached only to the nipple.  She does not open her mouth widely or extend her tongue to grasp the breast.  When she was sucking my gloved finger I noted that she humps her tongue.  A #24 nipple shield was applied to try and get past her tongue. She held it in her mouth but did not suckle.  Mom is working hard to latch her.  She reports that [name] has more shallow latches than deep.  Mom's breasts are filling and we were easily able to express 10ml from one side. I explained to her that it was important for [name] to latch to prevent engorgement and to assure that [name] was eating.  Mom is to call me for the next feeding.  This has been communicated to her RN.  Patient Name: Katherine Spears Today's Date: 03/08/2014 Reason for consult: Follow-up assessment;Infant < 6lbs   Maternal Data Has patient been taught Hand Expression?: Yes  Feeding Feeding Type: Breast Fed Length of feed: 0 min  LATCH Score/Interventions Latch: Repeated attempts needed to sustain latch, nipple held in mouth throughout feeding, stimulation needed to elicit sucking reflex.  Audible Swallowing: A few with stimulation  Type of Nipple: Everted at rest and after stimulation  Comfort (Breast/Nipple): Filling, red/small blisters or bruises, mild/mod discomfort  Problem noted: Filling Interventions (Filling): Massage;Reverse pressure;Hand pump Interventions  (Cracked/bleeding/bruising/blister): Expressed breast milk to nipple Interventions (Mild/moderate discomfort): Pre-pump if needed  Hold (Positioning): Assistance needed to correctly position infant at breast and maintain latch. Intervention(s): Breastfeeding basics reviewed;Support Pillows  LATCH Score: 6  Lactation Tools Discussed/Used Tools: Nipple Shields Nipple shield size: 24 Pump Review: Setup, frequency, and  cleaning;Milk Storage Initiated by:: LC Date initiated:: 03/08/14   Consult Status Consult Status: Follow-up Date: 03/08/14 Follow-up type: In-patient    Katherine Spears, Katherine Spears 03/08/2014, 9:57 AM

## 2014-03-08 NOTE — Progress Notes (Signed)
PPD#2 Pt is doing well. Waiting on peds for bili level. VSSAF IMP/ Doing well Plan/ Will discharge to home.

## 2014-03-08 NOTE — Lactation Note (Signed)
This note was copied from the chart of Katherine Miyoko Maynor. Lactation Consultation Note Mom did a great job of getting baby latched to the bare breast.  Her latch was deeper and the suckles more effective.  She drained parts of the breast well.  Plan for now is to feed the baby and post pump if baby does not soften the breast.  Double electric Breast pump to be set-up at the bed side.  Follow-up tomorrow.  Patient Name: Katherine Spears Today's Date: 03/08/2014 Reason for consult: Difficult latch;Infant weight loss;Infant < 6lbs   Maternal Data    Feeding Feeding Type: Breast Fed Length of feed: 12 min  LATCH Score/Interventions Latch: Grasps breast easily, tongue down, lips flanged, rhythmical sucking.  Audible Swallowing: Spontaneous and intermittent  Type of Nipple: Everted at rest and after stimulation  Comfort (Breast/Nipple): Soft / non-tender  Interventions  (Cracked/bleeding/bruising/blister): Hand pump Interventions (Mild/moderate discomfort): Post-pump  Hold (Positioning): No assistance needed to correctly position infant at breast. Intervention(s): Support Pillows;Skin to skin  LATCH Score: 10  Lactation Tools Discussed/Used Tools: Nipple Shields Nipple shield size:  (Did not use) WIC Program: Yes   Consult Status Consult Status: Follow-up Date: 03/08/14 Follow-up type: In-patient    Soyla DryerJoseph, Mechell Girgis 03/08/2014, 1:47 PM

## 2014-03-08 NOTE — Lactation Note (Signed)
This note was copied from the chart of Girl Katherine Spears. Lactation Consultation Note  Assisted mom with latching [name]. She initially latched and held the nipple in her mouth but did not suckle. Colostrum was dribbled into the corner of her mouth and she started to suckle with long jaw excursions.  I had to entice her with colostrum a couple of times but she ate well.  Mom stated this was her best feeding and was happy with the nipple shield.  Plan is to watch a few more feedings.  Mom is to post pump.  If the baby is not discharged a double electric breast pump will be set up.  Patient Name: Girl Nils FlackDynishia Stankus Today's Date: 03/08/2014 Reason for consult: Follow-up assessment;Infant < 6lbs   Maternal Data Has patient been taught Hand Expression?: Yes  Feeding Feeding Type: Breast Fed Length of feed: 15 min  LATCH Score/Interventions Latch: Repeated attempts needed to sustain latch, nipple held in mouth throughout feeding, stimulation needed to elicit sucking reflex. (Suckled once colostrum was placed in corner of mouth)  Audible Swallowing: A few with stimulation  Type of Nipple: Everted at rest and after stimulation  Comfort (Breast/Nipple): Soft / non-tender  Problem noted: Filling Interventions (Filling): Massage;Reverse pressure;Hand pump Interventions  (Cracked/bleeding/bruising/blister): Hand pump Interventions (Mild/moderate discomfort): Post-pump  Hold (Positioning): Assistance needed to correctly position infant at breast and maintain latch. Intervention(s): Support Pillows;Skin to skin  LATCH Score: 7  Lactation Tools Discussed/Used Tools: Nipple Shields Nipple shield size: 24 WIC Program: Yes Pump Review: Setup, frequency, and cleaning;Milk Storage Initiated by:: LC Date initiated:: 03/08/14   Consult Status Consult Status: Follow-up Date: 03/08/14 Follow-up type: In-patient    Soyla DryerJoseph, Dayleen Beske 03/08/2014, 11:51 AM

## 2014-03-09 ENCOUNTER — Ambulatory Visit: Payer: Self-pay

## 2014-03-09 NOTE — Lactation Note (Signed)
This note was copied from the chart of Katherine Spears. Lactation Consultation Note  Mom reports that BF is going well. She is able to latch [name] independently. Many swallows heard.  Discussed ways to prevent engorgement.  Patient Name: Katherine Spears Today's Date: 03/09/2014     Maternal Data    Feeding Feeding Type: Breast Fed Length of feed: 15 min  LATCH Score/Interventions Latch: Grasps breast easily, tongue down, lips flanged, rhythmical sucking.  Audible Swallowing: Spontaneous and intermittent  Type of Nipple: Everted at rest and after stimulation  Comfort (Breast/Nipple): Soft / non-tender     Hold (Positioning): No assistance needed to correctly position infant at breast.  LATCH Score: 10  Lactation Tools Discussed/Used     Consult Status Consult Status: Complete    Soyla DryerJoseph, Livianna Petraglia 03/09/2014, 9:35 AM

## 2016-04-26 IMAGING — US US OB LIMITED
1 series · 14 of 27 positions shown · non-contrast
Comparison: none

CLINICAL DATA: Abdominal pain

EXAM:
LIMITED OBSTETRIC ULTRASOUND

[Series 1: us ob limited · 0.21mm/px · 14 of 27 slices shown]
[im 1/27]
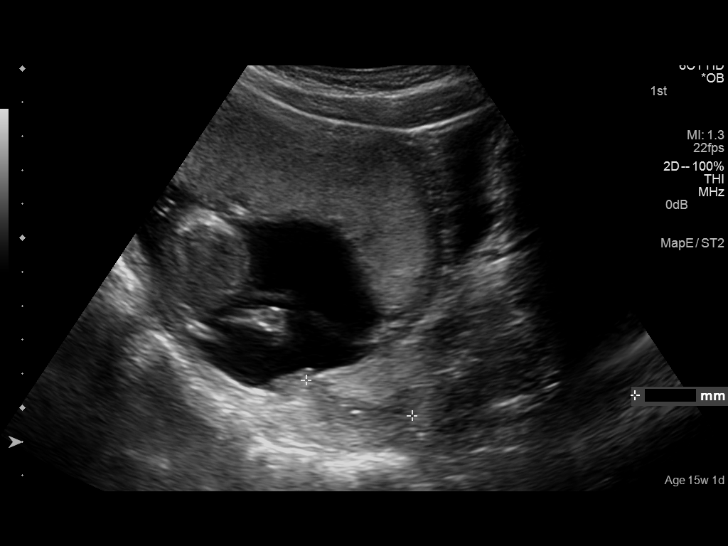
[im 3/27]
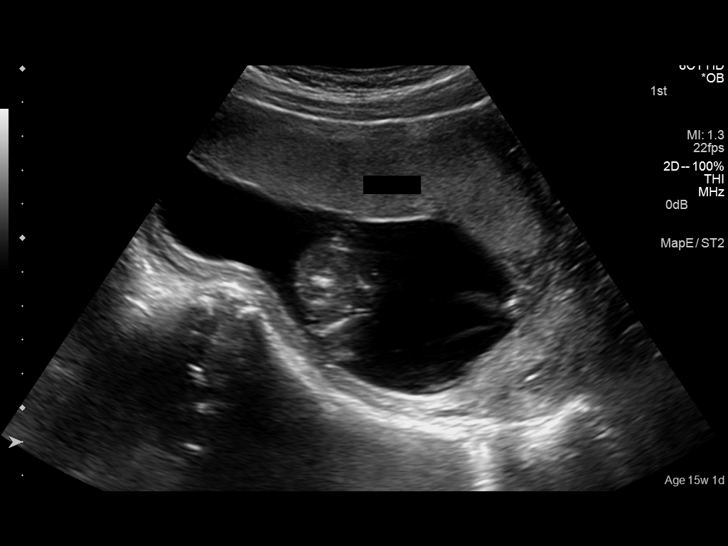
[im 5/27]
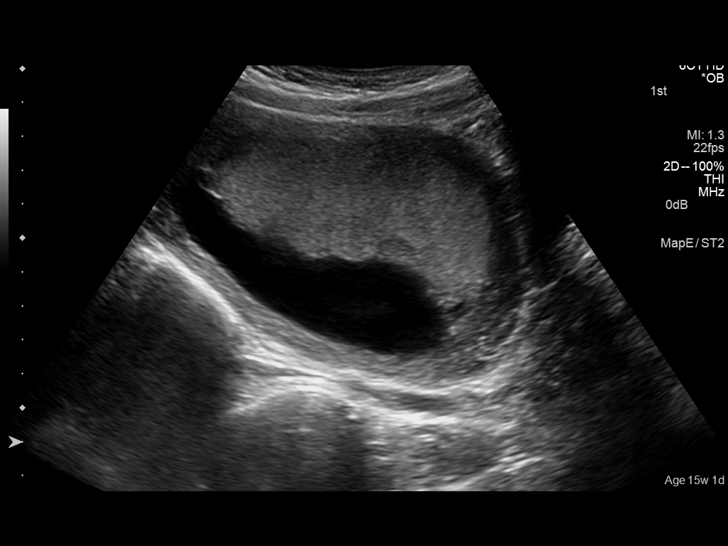
[im 7/27]
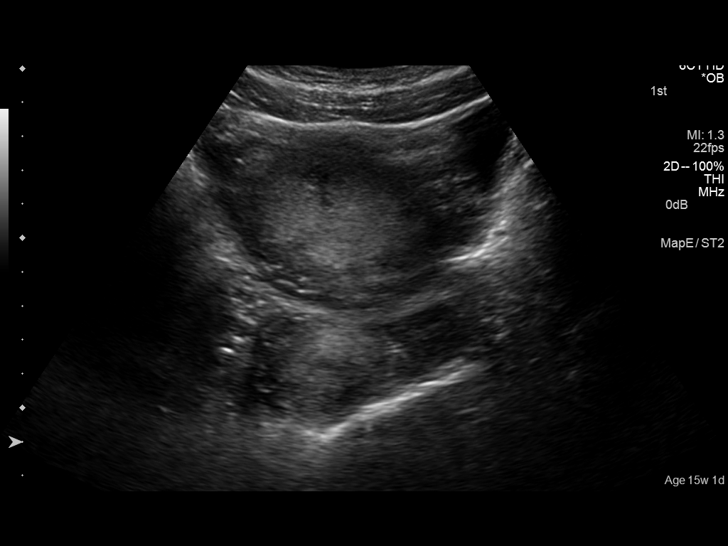
[im 9/27]
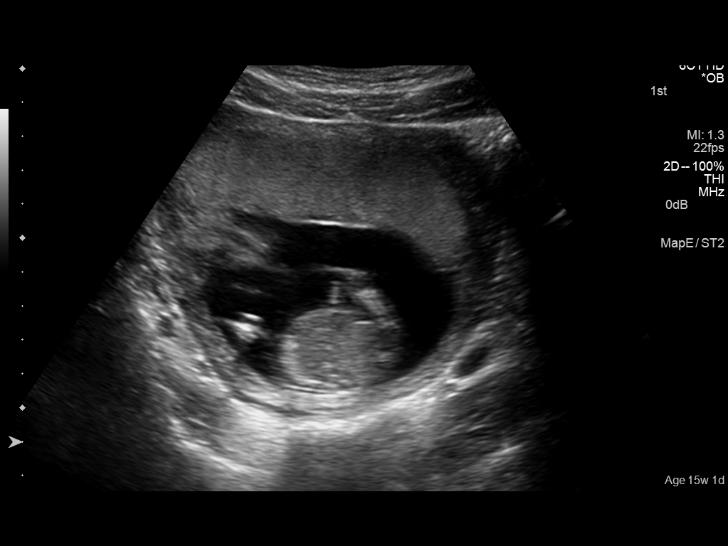
[im 11/27]
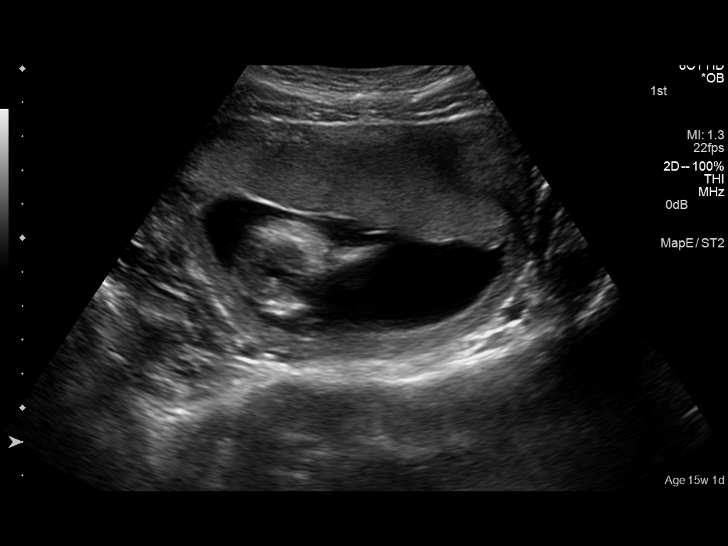
[im 13/27]
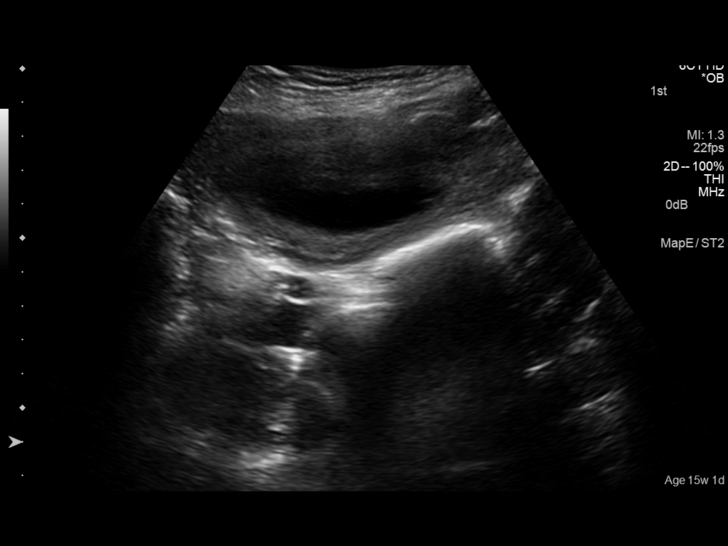
[im 15/27]
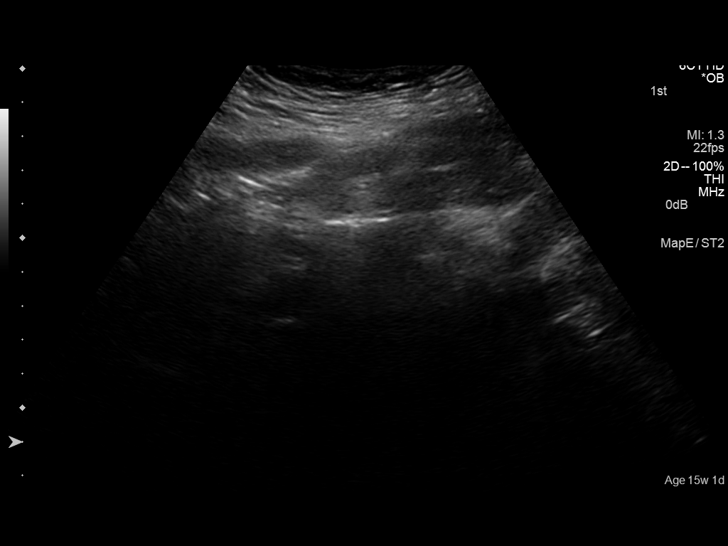
[im 17/27]
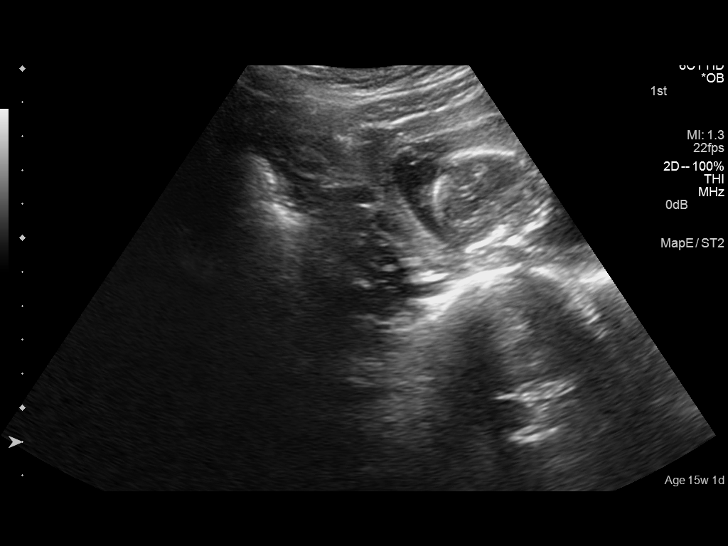
[im 19/27]
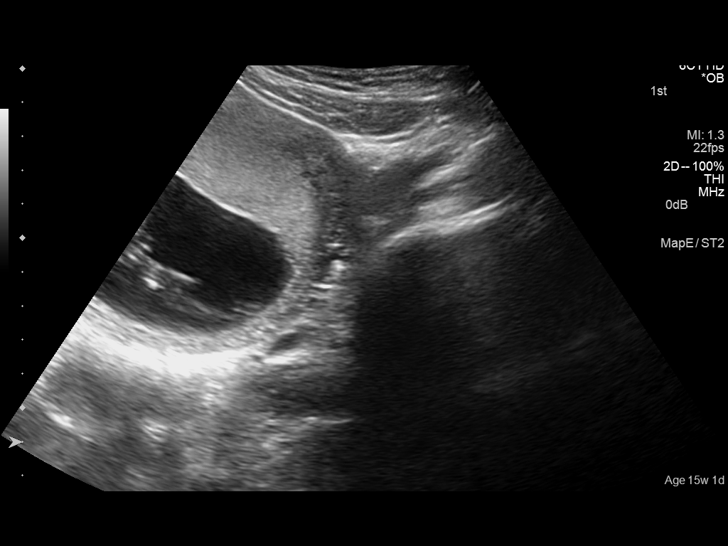
[im 21/27]
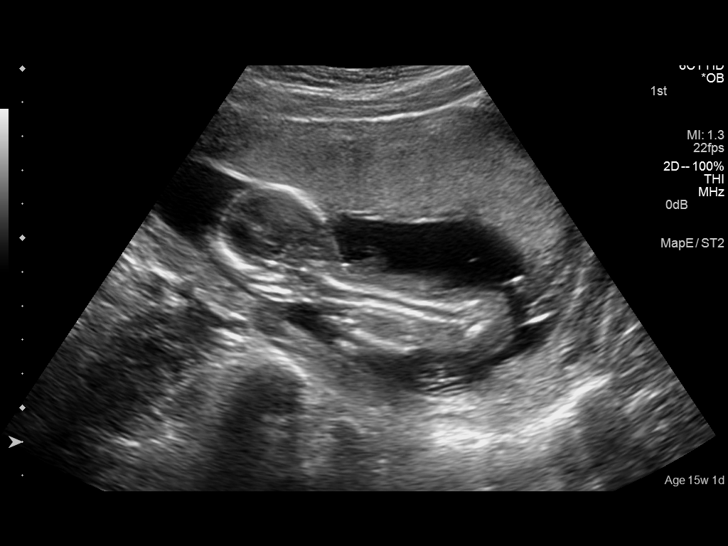
[im 23/27]
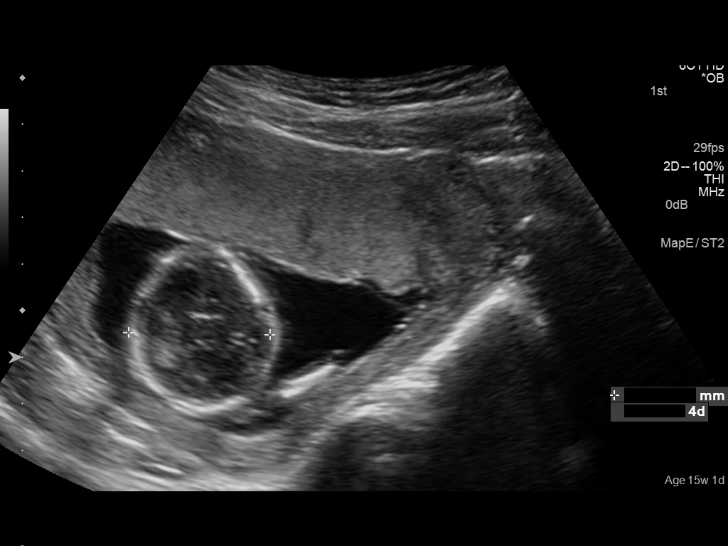
[im 25/27]
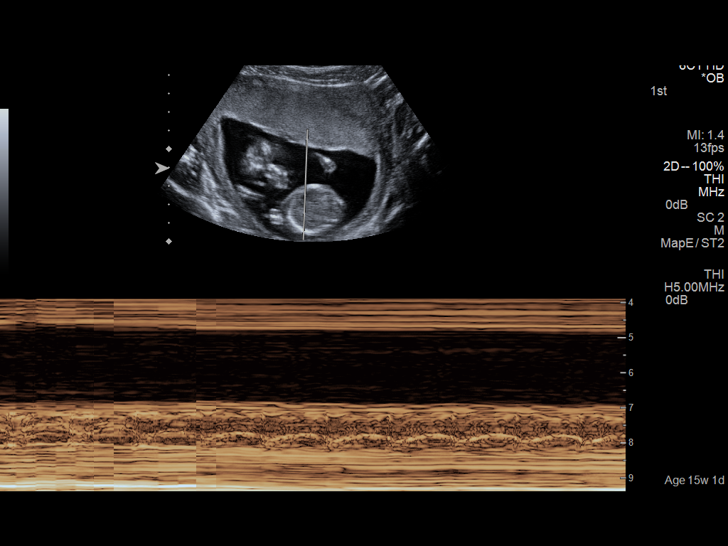
[im 27/27]
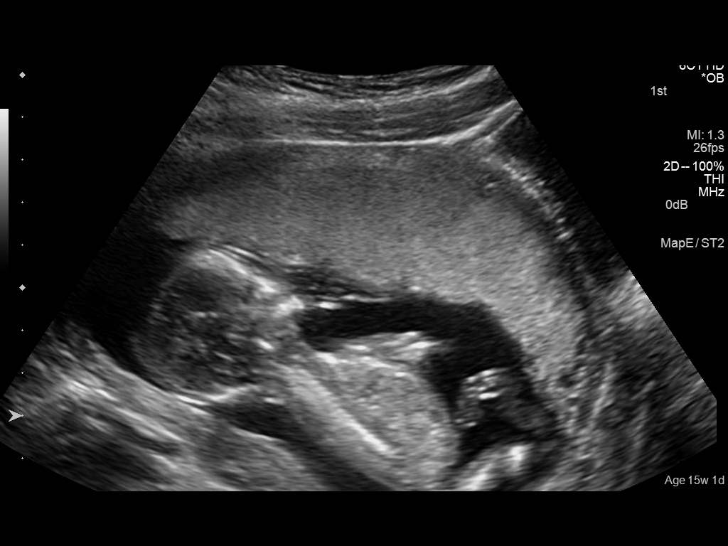

[14 of 27 positions shown; findings below may reference images not displayed]

FINDINGS: Number of Fetuses: 1

Heart Rate:  158 bpm

Movement: Yes

Presentation: Variable

Placental Location: Anterior

Previa: No

Amniotic Fluid (Subjective):  Within normal limits.

BPD:  30cm 15w  3d

MATERNAL FINDINGS:

Cervix:  Appears closed.

Uterus/Adnexae:  No abnormality visualized.
IMPRESSION: Normal fetus and placenta on limited exam.  Normal amniotic fluid.

This exam is performed on an emergent basis and does not
comprehensively evaluate fetal size, dating, or anatomy; follow-up
complete OB US should be considered if further fetal assessment is
warranted.

## 2023-03-30 NOTE — Progress Notes (Signed)
 Assessed pt in lobby, pt deemed to be having current chest pain, worsening as sitting, brought pt back into a room for further evaluation.

## 2023-03-30 NOTE — Progress Notes (Signed)
 Subjective:   Chief Complaint  Patient presents with  . Chest Pain    Neck pain started earlier this morning, in between shoulder blades, radiates down into middle of back. Pt states the chest pain started after. Pt reports that she does have a strenuous job, could have tweaked something at work. Pt did report she had an episode of twisting where she felt like something tore and the pain has been ongoing since.   . Neck Pain  . Back Pain  . Shortness of Breath    Reports shortness of breath when trying to take a deep breath, describes it as she has a cage restricting her breathing. Motrin  taken around 845 this morning with no relief to the neck pain.      History of Present Illness The patient is a 33 year old female presenting with chest pain.  She reports acute onset of upper thoracic midline back pain while lifting heavy objects at her job. Now having pain that radiates up her neck and around to her chest, worse with any abrupt movements and certain positioning, has not tried anything for relief, no associated shortness of breath, weakness, nor numbness. Reports a slight tingling sensation to the thoracic spine region between her shoulder blades.    Parts of patient history reviewed include PMH, problem list, medications, allergies, and social history.  Objective:   Vitals:   03/30/23 1305  BP: 130/70  Pulse: 71  Resp: (!) 22  Temp: 98.8 F (37.1 C)  TempSrc: Oral  SpO2: 100%  Weight: 70.8 kg (156 lb)  Height: 1.651 m (5' 5)    Physical Exam Vitals reviewed.  Constitutional:      Appearance: Normal appearance.  HENT:     Head: Normocephalic and atraumatic.  Eyes:     Extraocular Movements: Extraocular movements intact.  Cardiovascular:     Rate and Rhythm: Normal rate and regular rhythm.     Pulses: Normal pulses.     Heart sounds: Normal heart sounds.  Pulmonary:     Effort: Pulmonary effort is normal.     Breath sounds: Normal breath sounds.   Musculoskeletal:     Cervical back: Spasms and tenderness present. No swelling, edema, deformity or bony tenderness. Pain with movement present. Decreased range of motion.     Thoracic back: Spasms and tenderness present. No swelling, edema or bony tenderness. Decreased range of motion.       Back:  Skin:    General: Skin is warm and dry.  Neurological:     General: No focal deficit present.     Mental Status: She is alert and oriented to person, place, and time.     Cranial Nerves: No cranial nerve deficit.     Sensory: No sensory deficit.     Motor: No weakness.     Coordination: Coordination normal.     Gait: Gait normal.            Assessment/Plan:   Trinnity was seen today for chest pain, neck pain, back pain and shortness of breath.  Diagnoses and all orders for this visit:  Chest pain, unspecified type -     ECG 12 lead  Acute midline thoracic back pain -     ketorolac (TORADOL) injection 15 mg     Assessment & Plan 33 yo female presents with c/o acute onset of pain between the shoulder blades radiating up her neck and to her shoulders and anterior chest wall starting when lifting some heavy boxes at  work today.  EKG reveals NSR with no acute ST or Twave changes. Lungs CTAB, no hypoxia nor dyspnea, no evidence to suggest ACS nor other cardiopulmonary pathology. Patient with focal tenderness and spasm to the paraspinal muscles in the thoracic area between shoulder blades. No bony midline TTP to cervical nor thoracic spine. No imaging indicated. Neuro exam reveals no deficits, no evidence of cord compression. Overall clinical picture most c/w acute thoracic back strain/spasm. Toradol given here which provided significant relief. Recommend NSAIDs, heat and muscle relaxants PRN. Follow up with PCP as needed.  Patient will return for new or worsening symptoms. I discussed the findings today, diagnosis/differential diagnosis, plan and red flags that require return for  reevaluation with PCP, Urgent care or EMERGENCY. Patient was agreeable to outlined plan and questions were answered, felt stable for discharge.    Home Care   Electronically signed by: Jon Earnie Flank, NP 03/30/2023 1:38 PM

## 2023-11-24 ENCOUNTER — Inpatient Hospital Stay (HOSPITAL_COMMUNITY)
Admission: AD | Admit: 2023-11-24 | Discharge: 2023-11-24 | Disposition: A | Discharge status: Home / Self Care | Payer: Self-pay | Attending: Obstetrics and Gynecology | Admitting: Obstetrics and Gynecology

## 2023-11-24 DIAGNOSIS — N939 Abnormal uterine and vaginal bleeding, unspecified: Secondary | ICD-10-CM | POA: Insufficient documentation

## 2023-11-24 DIAGNOSIS — R109 Unspecified abdominal pain: Secondary | ICD-10-CM | POA: Insufficient documentation

## 2023-11-24 DIAGNOSIS — Z87891 Personal history of nicotine dependence: Secondary | ICD-10-CM | POA: Insufficient documentation

## 2023-11-24 DIAGNOSIS — R42 Dizziness and giddiness: Secondary | ICD-10-CM | POA: Insufficient documentation

## 2023-11-24 DIAGNOSIS — Z5329 Procedure and treatment not carried out because of patient's decision for other reasons: Secondary | ICD-10-CM | POA: Insufficient documentation

## 2023-11-24 LAB — CBC
HCT: 37.4 % (ref 36.0–46.0)
Hemoglobin: 12 g/dL (ref 12.0–15.0)
MCH: 28.5 pg (ref 26.0–34.0)
MCHC: 32.1 g/dL (ref 30.0–36.0)
MCV: 88.8 fL (ref 80.0–100.0)
Platelets: 295 K/uL (ref 150–400)
RBC: 4.21 MIL/uL (ref 3.87–5.11)
RDW: 13 % (ref 11.5–15.5)
WBC: 10.9 K/uL — ABNORMAL HIGH (ref 4.0–10.5)
nRBC: 0 % (ref 0.0–0.2)

## 2023-11-24 LAB — WET PREP, GENITAL
Clue Cells Wet Prep HPF POC: NONE SEEN
Sperm: NONE SEEN
Trich, Wet Prep: NONE SEEN
WBC, Wet Prep HPF POC: >=10 — AB (ref <10)
Yeast Wet Prep HPF POC: NONE SEEN

## 2023-11-24 LAB — POCT PREGNANCY, URINE: Preg Test, Ur: NEGATIVE

## 2023-11-24 NOTE — Discharge Instructions (Addendum)
 We are glad you are doing so much better! You have been diagnosed with abnormal uterine bleeding. Abnormal uterine bleeding means bleeding more than normal from your uterus. Any kind of bleeding that is not normal should be checked by a doctor. Treatment depends on the cause of your bleeding and how much you bleed. We were not able to find an obvious source of your bleeding on exam today. Please follow up closely with a local GYN. We have attached information on local gynecologists to follow up with.  Come back to the ED if: you faint, you have to change pads every hour, or you pass large blood clots from your vagina. The bleeding lasts more than one week. You feel dizzy at times. You feel like you may vomit (nausea). You vomit. You feel light-headed or weak. Your symptoms get worse. You faint. You have extreme pain in your belly. You have a fever or chills. You get sweaty or weak. You feel you need to be seen in the ED  [facility] Ob/Gyn Providers    [facility] [employer] at [employer] [facility]    Phone: [phone]  [facility] [employer] at [place]   Phone: [phone]  [facility] [employer] at [place]  Phone: [phone]  Center for [facility] at [facility]  Phone: [phone]  Center for [facility] at [place]  Phone: [phone]  [facility] for [facility] at [facility]   Phone: [phone]  [facility]       Phone: [phone]  [facility]    Phone: [phone]   [facility]    Phone: [phone]  [facility]    Phone: [phone]  [facility]    Phone: [phone]  [facility]       Phone: [phone]   [facility] Department-Maternity  Phone: [phone]  [facility]    Phone: [phone]  [facility]  [place]   Phone: [phone]  [facility]      Phone: [phone]  [facility]    Phone: [phone]

## 2023-11-24 NOTE — MAU Provider Note (Addendum)
 Chief Complaint:  Vaginal Bleeding and Abdominal Pain   HPI  Katherine Spears is a 33 y.o. G2P1001 presents to maternity admissions reporting persistent vaginal bleeding since 10/18. Patient states she had her normal period (flow, timing) from 10/03 - 10/07. She began experiencing extremely heavy periods on 10/18, going through a pad every 2 hours. She reports large clots, with the largest being around the size of a baseball. She endorses constant abdominal pain she describes as sharp and stabbing in the pelvic area, and pressure-like like contractions in her hips. Home pregnancy test on 10/24 negative and negative in MAU as well. Started wearing Depends diapers this week since the flow is so heavy. She also reports intermittent lightheadedness when getting up from a sitting position but denies CP, SOB, palpitations, N/V/D, or any other complaints at this time. She has not tried any medication for these symptoms, but a heating pad helps the abdominal pain. She has been eating and drinking well and denies any new medications, supplements, or more home/work stress than normal. She is sexually active and last had intercourse 10/04.   Past Medical History:  Diagnosis Date   Vaginal Pap smear, abnormal    OB History  Gravida Para Term Preterm AB Living  2 1 1   1   SAB IAB Ectopic Multiple Live Births     0 1    # Outcome Date GA Lbr Len/2nd Weight Sex Type Anes PTL Lv  2 Term 03/06/14 [redacted]w[redacted]d 596:02 / 00:07 2849 g F Vag-Spont Local  LIV     Birth Comments: none  1 Gravida            Past Surgical History:  Procedure Laterality Date   NO PAST SURGERIES     No family history on file. Social History   Tobacco Use   Smoking status: Former    Current packs/day: 0.01    Types: Cigarettes  Substance Use Topics   Alcohol use: No   Drug use: No   No Known Allergies No medications prior to admission.    I have reviewed patient's Past Medical Hx, Surgical Hx, Family Hx, Social Hx, medications  and allergies.   ROS  Pertinent items noted in HPI and remainder of comprehensive ROS otherwise negative.   PHYSICAL EXAM  Patient Vitals for the past 24 hrs:  BP Temp Temp src Pulse Resp SpO2 Height Weight  11/24/23 1524 114/61 (!) 97.5 F (36.4 C) Oral 72 16 99 % 5' 5 (1.651 m) 81 kg    Constitutional: Well-developed, well-nourished female in no acute distress.  Cardiovascular: normal rate & rhythm, warm and well-perfused, 2+ radial pulses HEENT: tachy mucous membranes, non-icteric sclera Respiratory: normal effort, no problems with respiration noted, good air movement throughout all lung fields GI: Abd soft, non-distended, TTP of the LUQ, RLQ, and suprapubic areas without masses/guarding/rebound MS: Extremities nontender, no edema, normal ROM Neurologic: Alert and oriented x 4, CN II-XII grossly intact  GU: no CVA tenderness Pelvic: normal external female genitalia, physiologic discharge, no blood, Chaperone present: Olam Dalton, CNM and [employer], RN   Labs: Results for orders placed or performed during the hospital encounter of 11/24/23 (from the past 24 hours)  Pregnancy, urine POC     Status: None   Collection Time: 11/24/23  3:32 PM  Result Value Ref Range   Preg Test, Ur NEGATIVE NEGATIVE  CBC     Status: Abnormal   Collection Time: 11/24/23  4:25 PM  Result Value Ref Range  WBC 10.9 (H) 4.0 - 10.5 K/uL   RBC 4.21 3.87 - 5.11 MIL/uL   Hemoglobin 12.0 12.0 - 15.0 g/dL   HCT 62.5 63.9 - 53.9 %   MCV 88.8 80.0 - 100.0 fL   MCH 28.5 26.0 - 34.0 pg   MCHC 32.1 30.0 - 36.0 g/dL   RDW 86.9 88.4 - 84.4 %   Platelets 295 150 - 400 K/uL   nRBC 0.0 0.0 - 0.2 %  Wet prep, genital     Status: Abnormal   Collection Time: 11/24/23  4:30 PM  Result Value Ref Range   Yeast Wet Prep HPF POC NONE SEEN NONE SEEN   Trich, Wet Prep NONE SEEN NONE SEEN   Clue Cells Wet Prep HPF POC NONE SEEN NONE SEEN   WBC, Wet Prep HPF POC >=10 (A) <10   Sperm NONE SEEN    MDM & MAU  COURSE  MDM: moderate  MAU Course: Orders Placed This Encounter  Procedures   Wet prep, genital   CBC   Pregnancy, urine POC   ASSESSMENT   1. Abnormal uterine bleeding    33 yo F G2P1 not currently pregnant presenting with primary complaint of heavy vaginal bleeding since 11/11/2023. Pelvic exam unremarkable, no frank blood in vaginal vault. Appears to be gradually resolving on its own. Will send home with information for local OBGYNs to set up follow up with.   1726: Patient left AMA without waiting for results or discharge paperwork.   PLAN   Camie Dixons, DO PGY-1 Family Medicine Resident [facility] Residency  Attestation of Supervision of Student:  I confirm that I have verified the information documented in the Resident Physician's  student's note and that I have also personally reperformed the history, physical exam and all medical decision making activities.  I have verified that all services and findings are accurately documented in this student's note; and I agree with management and plan as outlined in the documentation. I have also made any necessary editorial changes.   Olam Dalton, MSN, [facility], Center for [employer]

## 2023-11-24 NOTE — MAU Note (Signed)
 Katherine Spears is a 33 y.o. at Unknown here in MAU reporting: had period beg of the month.  Started bleeding again on 10/18, been bleeding ever since.  2 days when it severe, bad cramping and clotting. (10/21 and 10/28). Wearing diapers now- changes every 2 hrs. Cramping is mild now.  Neg preg  10/24 LMP: 10/3 Onset of complaint: 10/18 Pain score: mild Vitals:   11/24/23 1524  BP: 114/61  Pulse: 72  Resp: 16  Temp: (!) 97.5 F (36.4 C)  SpO2: 99%     Lab orders placed from triage:  upt

## 2023-11-24 NOTE — MAU Note (Signed)
 In to check on pt-- no sign of pt in room or bathroom, all clothes and belongings gone. Pt left AMA.

## 2023-11-25 ENCOUNTER — Ambulatory Visit: Payer: Self-pay | Admitting: Obstetrics and Gynecology

## 2023-11-27 LAB — GC/CHLAMYDIA PROBE AMP (~~LOC~~) NOT AT ARMC
Chlamydia: NEGATIVE
Comment: NEGATIVE
Comment: NORMAL
Neisseria Gonorrhea: NEGATIVE
